# Patient Record
Sex: Female | Born: 1980 | Race: Asian | Hispanic: No | Marital: Married | Smoking: Never smoker
Health system: Southern US, Community
[De-identification: ages and names within clinical notes are randomized; demographics above are authoritative.]

## PROBLEM LIST (undated history)

## (undated) DIAGNOSIS — D649 Anemia, unspecified: Secondary | ICD-10-CM

## (undated) DIAGNOSIS — M546 Pain in thoracic spine: Secondary | ICD-10-CM

## (undated) DIAGNOSIS — R000 Tachycardia, unspecified: Secondary | ICD-10-CM

## (undated) DIAGNOSIS — T7840XA Allergy, unspecified, initial encounter: Secondary | ICD-10-CM

## (undated) DIAGNOSIS — R42 Dizziness and giddiness: Secondary | ICD-10-CM

## (undated) DIAGNOSIS — Z8619 Personal history of other infectious and parasitic diseases: Secondary | ICD-10-CM

## (undated) DIAGNOSIS — M25512 Pain in left shoulder: Secondary | ICD-10-CM

## (undated) DIAGNOSIS — M5412 Radiculopathy, cervical region: Secondary | ICD-10-CM

## (undated) DIAGNOSIS — E785 Hyperlipidemia, unspecified: Secondary | ICD-10-CM

## (undated) DIAGNOSIS — R739 Hyperglycemia, unspecified: Secondary | ICD-10-CM

## (undated) DIAGNOSIS — R011 Cardiac murmur, unspecified: Secondary | ICD-10-CM

## (undated) DIAGNOSIS — M545 Low back pain, unspecified: Secondary | ICD-10-CM

## (undated) HISTORY — DX: Allergy, unspecified, initial encounter: T78.40XA

## (undated) HISTORY — DX: Pain in thoracic spine: M54.6

## (undated) HISTORY — DX: Hyperlipidemia, unspecified: E78.5

## (undated) HISTORY — DX: Pain in left shoulder: M25.512

## (undated) HISTORY — DX: Personal history of other infectious and parasitic diseases: Z86.19

## (undated) HISTORY — DX: Anemia, unspecified: D64.9

## (undated) HISTORY — PX: HERNIA REPAIR: SHX51

## (undated) HISTORY — DX: Tachycardia, unspecified: R00.0

## (undated) HISTORY — DX: Low back pain, unspecified: M54.50

## (undated) HISTORY — DX: Hyperglycemia, unspecified: R73.9

## (undated) HISTORY — DX: Cardiac murmur, unspecified: R01.1

## (undated) HISTORY — DX: Radiculopathy, cervical region: M54.12

## (undated) HISTORY — DX: Low back pain: M54.5

## (undated) HISTORY — DX: Dizziness and giddiness: R42

---

## 2007-03-30 ENCOUNTER — Ambulatory Visit (HOSPITAL_COMMUNITY): Admission: RE | Admit: 2007-03-30 | Discharge: 2007-03-30 | Payer: Self-pay | Admitting: Obstetrics and Gynecology

## 2011-03-01 ENCOUNTER — Other Ambulatory Visit (HOSPITAL_COMMUNITY)
Admission: RE | Admit: 2011-03-01 | Discharge: 2011-03-01 | Disposition: A | Discharge status: Home / Self Care | Payer: Self-pay | Source: Ambulatory Visit | Attending: Obstetrics & Gynecology | Admitting: Obstetrics & Gynecology

## 2011-03-01 DIAGNOSIS — Z1159 Encounter for screening for other viral diseases: Secondary | ICD-10-CM | POA: Insufficient documentation

## 2011-03-01 DIAGNOSIS — Z01419 Encounter for gynecological examination (general) (routine) without abnormal findings: Secondary | ICD-10-CM | POA: Insufficient documentation

## 2012-03-02 ENCOUNTER — Other Ambulatory Visit (HOSPITAL_COMMUNITY)
Admission: RE | Admit: 2012-03-02 | Discharge: 2012-03-02 | Disposition: A | Discharge status: Home / Self Care | Payer: BC Managed Care – PPO | Source: Ambulatory Visit | Attending: Obstetrics and Gynecology | Admitting: Obstetrics and Gynecology

## 2012-03-02 DIAGNOSIS — Z113 Encounter for screening for infections with a predominantly sexual mode of transmission: Secondary | ICD-10-CM | POA: Insufficient documentation

## 2012-03-02 DIAGNOSIS — Z124 Encounter for screening for malignant neoplasm of cervix: Secondary | ICD-10-CM | POA: Insufficient documentation

## 2013-12-24 ENCOUNTER — Ambulatory Visit (INDEPENDENT_AMBULATORY_CARE_PROVIDER_SITE_OTHER): Payer: BC Managed Care – PPO | Admitting: Internal Medicine

## 2013-12-24 ENCOUNTER — Encounter: Payer: Self-pay | Admitting: Internal Medicine

## 2013-12-24 VITALS — BP 119/79 | HR 80 | Ht 63.0 in | Wt 117.0 lb

## 2013-12-24 DIAGNOSIS — R0789 Other chest pain: Secondary | ICD-10-CM

## 2013-12-24 DIAGNOSIS — R002 Palpitations: Secondary | ICD-10-CM

## 2013-12-24 DIAGNOSIS — R0602 Shortness of breath: Secondary | ICD-10-CM

## 2013-12-24 LAB — CBC
HEMATOCRIT: 32.4 % — AB (ref 36.0–46.0)
HEMOGLOBIN: 10.3 g/dL — AB (ref 12.0–15.0)
MCHC: 31.9 g/dL (ref 30.0–36.0)
MCV: 73 fl — AB (ref 78.0–100.0)
PLATELETS: 240 10*3/uL (ref 150.0–400.0)
RBC: 4.43 Mil/uL (ref 3.87–5.11)
RDW: 15 % (ref 11.5–15.5)
WBC: 6.3 10*3/uL (ref 4.0–10.5)

## 2013-12-24 LAB — TSH: TSH: 1.49 u[IU]/mL (ref 0.35–4.50)

## 2013-12-24 NOTE — Progress Notes (Signed)
HPI   Patient is a 33 yo who is referred for evaluation of CP and palpitations. The patient says that she has had spells of back/chest/L arm pressure for the past 2 to 3 yrs.    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   \ No Known Allergies  No current outpatient prescriptions on file.   No current facility-administered medications for this visit.    Past Medical History  Diagnosis Date  . Heart murmur     Past Surgical History  Procedure Laterality Date  . Hernia repair      2007  . Cesarean section      2005    Family History  Problem Relation Age of Onset  . Hypertension Mother   . Hypertension Father   . Hypertension Maternal Grandmother   . Diabetes Maternal Grandfather   . Stroke Maternal Grandfather   . Hypertension Maternal Grandfather   . Hypertension Paternal Grandmother   . Hypertension Paternal Grandfather     History   Social History  . Marital Status: Married    Spouse Name: N/A    Number of Children: N/A  . Years of Education: N/A   Occupational History  . Not on file.   Social History Main Topics  . Smoking status: Never Smoker   . Smokeless tobacco: Not on file  . Alcohol Use: No  . Drug Use: No  . Sexual Activity: Not on file   Other Topics Concern  . 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  They are negative to the above problem except as previously stated.  Vital Signs: BP 119/79  Pulse 80  Ht 5\' 3"  (1.6 m)  Wt 117 lb (53.071 kg)  BMI 20.73 kg/m2  Physical Exam Patient is in NAD HEENT:  Normocephalic, atraumatic. EOMI, PERRLA.  Neck: JVP is normal.  No bruits.  Lungs: clear to  auscultation. No rales no wheezes.  Heart: Regular rate and rhythm. Normal S1, S2. No S3.   No significant murmurs. PMI not displaced.  Abdomen:  Supple, nontender. Normal bowel sounds. No masses. No hepatomegaly.  Extremities:   Good distal pulses throughout. No lower extremity edema.  Musculoskeletal :moving all extremities.  Neuro:   alert and oriented x3.  CN II-XII grossly intact.  EKG:  SR 80    Assessment and Plan:  1  CP  Patient's symtoms of CP are atypical  Does have some dyspnea.  WIll get echo to evaluate, mostly to evaluate dyspnea.  2.  Palpitations.  Short lived  Do not sound destabilizng.  Reassurer pt.  Check labs.

## 2013-12-24 NOTE — Patient Instructions (Signed)
Your physician recommends that you return for lab work in: TODAY.   (TSH, CBC)  Your physician recommends that you continue on your current medications as directed. Please refer to the Current Medication list given to you today.  Your physician has requested that you have an echocardiogram. Echocardiography is a painless test that uses sound waves to create images of your heart. It provides your doctor with information about the size and shape of your heart and how well your heart's chambers and valves are working. This procedure takes approximately one hour. There are no restrictions for this procedure.

## 2014-02-04 ENCOUNTER — Ambulatory Visit (HOSPITAL_COMMUNITY): Payer: BC Managed Care – PPO | Attending: Cardiovascular Disease | Admitting: Cardiology

## 2014-02-04 DIAGNOSIS — R0789 Other chest pain: Secondary | ICD-10-CM

## 2014-02-04 DIAGNOSIS — R0609 Other forms of dyspnea: Secondary | ICD-10-CM | POA: Insufficient documentation

## 2014-02-04 DIAGNOSIS — R002 Palpitations: Secondary | ICD-10-CM | POA: Insufficient documentation

## 2014-02-04 DIAGNOSIS — R0602 Shortness of breath: Secondary | ICD-10-CM

## 2014-02-04 DIAGNOSIS — R079 Chest pain, unspecified: Secondary | ICD-10-CM | POA: Insufficient documentation

## 2014-02-04 DIAGNOSIS — R0989 Other specified symptoms and signs involving the circulatory and respiratory systems: Secondary | ICD-10-CM | POA: Insufficient documentation

## 2014-02-04 NOTE — Progress Notes (Signed)
Echo performed. 

## 2014-02-11 ENCOUNTER — Other Ambulatory Visit: Payer: Self-pay | Admitting: *Deleted

## 2014-02-11 ENCOUNTER — Telehealth: Payer: Self-pay | Admitting: *Deleted

## 2014-02-11 DIAGNOSIS — D582 Other hemoglobinopathies: Secondary | ICD-10-CM

## 2014-02-11 DIAGNOSIS — R002 Palpitations: Secondary | ICD-10-CM

## 2014-02-11 NOTE — Telephone Encounter (Signed)
Message from Dr. Tenny Crawoss. 02/08/14:  "Echo is NORMAL. Systolic and diastolic properites of heart are normal. Please set patient up for regular stres test to eval palpitaitons and exercise capacity."  Informed patient someone will be calling her to set up GXT. Also, she inquired about labs from last month. Informed her of Dr. Tenny Crawoss recommendation for abnormal H andH :    Notes Recorded by Pricilla RifflePaula Ross V, MD on 12/25/2013 at 10:02 AM  Thyroid funciton is normal  Patient is mildly anemic. When comes in for echo order anemia panel. Order placed for anemia panel.  She will schedule on same day as the GXT.

## 2014-03-15 ENCOUNTER — Encounter: Payer: BC Managed Care – PPO | Admitting: Nurse Practitioner

## 2014-03-15 ENCOUNTER — Other Ambulatory Visit: Payer: BC Managed Care – PPO

## 2014-03-21 ENCOUNTER — Ambulatory Visit (INDEPENDENT_AMBULATORY_CARE_PROVIDER_SITE_OTHER): Payer: BC Managed Care – PPO

## 2014-03-21 ENCOUNTER — Encounter: Payer: BC Managed Care – PPO | Admitting: Physician Assistant

## 2014-03-21 ENCOUNTER — Ambulatory Visit (INDEPENDENT_AMBULATORY_CARE_PROVIDER_SITE_OTHER): Payer: BC Managed Care – PPO | Admitting: *Deleted

## 2014-03-21 DIAGNOSIS — D582 Other hemoglobinopathies: Secondary | ICD-10-CM

## 2014-03-21 DIAGNOSIS — R0602 Shortness of breath: Secondary | ICD-10-CM

## 2014-03-21 DIAGNOSIS — R002 Palpitations: Secondary | ICD-10-CM

## 2014-03-21 LAB — VITAMIN B12: VITAMIN B 12: 439 pg/mL (ref 211–911)

## 2014-03-21 LAB — IBC PANEL
IRON: 30 ug/dL — AB (ref 42–145)
SATURATION RATIOS: 6.2 % — AB (ref 20.0–50.0)
TRANSFERRIN: 343 mg/dL (ref 212.0–360.0)

## 2014-03-21 LAB — FOLATE: Folate: 17.2 ng/mL (ref >5.9)

## 2014-03-21 LAB — FERRITIN: Ferritin: 2.6 ng/mL — ABNORMAL LOW (ref 10.0–291.0)

## 2014-03-21 NOTE — Progress Notes (Signed)
Exercise Treadmill Test  Pre-Exercise Testing Evaluation Rhythm: normal sinus  Rate: 84 bpm     Test  Exercise Tolerance Test Ordering MD: Dietrich Pates, MD  Interpreting MD: Carlean Jews, PA  Unique Test No: 1  Treadmill:  1  Indication for ETT: exertional dyspnea/Palps  Contraindication to ETT: No   Stress Modality: exercise - treadmill  Cardiac Imaging Performed: non   Protocol: standard Bruce - maximal  Max BP:  160/56  Max MPHR (bpm):  186 85% MPR (bpm):  158  MPHR obtained (bpm):  173 % MPHR obtained:  93%  Reached 85% MPHR (min:sec):   Total Exercise Time (min-sec): 8 min  Workload in METS:  10 Borg Scale: very hard (17)  Reason ETT Terminated:  desired heart rate attained    ST Segment Analysis At Rest: normal ST segments - no evidence of significant ST depression With Exercise: no evidence of significant ST depression  Other Information Arrhythmia:  No Angina during ETT:  absent (0) Quality of ETT:  diagnostic  ETT Interpretation:  normal - no evidence of ischemia by ST analysis  Comments: Patient did very well. She has some slight DOE but no chest pain.   Recommendations: Follow up with primary care and Dr. Tenny Craw as needed.

## 2014-03-26 ENCOUNTER — Telehealth: Payer: Self-pay

## 2014-03-26 DIAGNOSIS — D509 Iron deficiency anemia, unspecified: Secondary | ICD-10-CM

## 2014-03-26 NOTE — Telephone Encounter (Signed)
lmtcb

## 2014-03-26 NOTE — Telephone Encounter (Signed)
Tara Stevenson patient ,wil forward back to [employer] RN

## 2014-03-26 NOTE — Telephone Encounter (Signed)
Message copied by Nori Riis on Tue Mar 26, 2014  9:37 AM ------      Message from: Dietrich Pates V      Created: Mon Mar 25, 2014 12:41 PM       Patient with iron deficiency.  WOuld recomm 324 mg FeSO4 2x per day.      Are her menses heavy?      F/U CBC in 2 months.  Call with problems ------

## 2014-03-26 NOTE — Telephone Encounter (Signed)
Called pt per Dr. Tenny Craw recommendation.  Advised her that Dr. Tenny Craw would like for her to start on FeSO4 BID.  States she has some iron tablets at home, she thinks they are 500 mg and will take those-she doesn't want to take pill BID. States her menses is heavy.  Scheduled her for CBC in November. Advised to call if has any questions or concerns.

## 2014-03-26 NOTE — Addendum Note (Signed)
Addended by: Murrell Redden E on: 03/26/2014 11:37 AM   Modules accepted: Orders

## 2014-05-28 ENCOUNTER — Other Ambulatory Visit: Payer: BC Managed Care – PPO

## 2014-08-12 ENCOUNTER — Ambulatory Visit: Payer: BLUE CROSS/BLUE SHIELD | Admitting: Neurology

## 2014-08-12 ENCOUNTER — Telehealth: Payer: Self-pay | Admitting: Neurology

## 2014-08-12 NOTE — Telephone Encounter (Signed)
Called and left Vm message that office closed today until 10:00.

## 2014-08-27 ENCOUNTER — Encounter: Payer: Self-pay | Admitting: Neurology

## 2014-08-28 ENCOUNTER — Encounter: Payer: Self-pay | Admitting: Neurology

## 2014-08-28 ENCOUNTER — Ambulatory Visit: Payer: BLUE CROSS/BLUE SHIELD | Admitting: Neurology

## 2014-09-04 ENCOUNTER — Encounter: Payer: Self-pay | Admitting: Neurology

## 2014-11-08 ENCOUNTER — Ambulatory Visit (INDEPENDENT_AMBULATORY_CARE_PROVIDER_SITE_OTHER): Payer: BLUE CROSS/BLUE SHIELD | Admitting: Neurology

## 2014-11-08 ENCOUNTER — Encounter: Payer: Self-pay | Admitting: Neurology

## 2014-11-08 VITALS — BP 125/81 | HR 82 | Resp 14 | Ht 63.0 in | Wt 120.0 lb

## 2014-11-08 DIAGNOSIS — M25512 Pain in left shoulder: Secondary | ICD-10-CM | POA: Diagnosis not present

## 2014-11-08 DIAGNOSIS — R202 Paresthesia of skin: Secondary | ICD-10-CM | POA: Diagnosis not present

## 2014-11-08 DIAGNOSIS — M79602 Pain in left arm: Secondary | ICD-10-CM | POA: Diagnosis not present

## 2014-11-08 NOTE — Progress Notes (Signed)
Subjective:    Patient ID: Tara Stevenson is a 34 y.o. female.  HPI     Tara Foley, MD, PhD [facility] [address] [address] [place], [place] [address]  Dear Dr. Shelle Iron,   I saw your patient, Tara Stevenson, upon your kind request in my neurologic clinic today for initial consultation of her left-sided neck pain radiating to her left anterior forearm, associated with left-sided grip weakness. The patient is unaccompanied today. As you know, Tara Stevenson is a 34 year old right-handed woman with an underlying benign medical history, who reports left shoulder, neck pain radiating to her left arm since 2009 after she had her C-section. Symptoms are nonprogressive, but also have not improved very much. She has gone through physical therapy, has tried anti-inflammatory medication, and you had consider trigger point injections. She was given exercises which she has not been doing regularly and when you saw her on 07/15/2014 after her cervical spine MRI you noted a fairly normal neurological exam. She had an MRI cervical spine without contrast on 07/08/2014, which showed mild reversal of the cervical lordosis suggestive of muscle spasm, otherwise unremarkable study. You talked her about the test results. She was advised to continue with home exercises and changing carrying her purse from her left side to her right side. She has not had an EMG nerve conduction test.  She gets routine blood work done through her GYN. She gets a physical every year. She says blood work was fine including thyroid function tests. She reports intermittent tingling in her left forearm in particular her ring and middle finger. She reports grip strength weakness and some fatigue throughout the day. 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 She has 2 children, the younger one is 37 years old. She volunteers at their school. She is not impaired in her activities of daily living. She has not noticed  any twitching or abnormal involuntary movements otherwise or muscle wasting. She has not noticed any permanent weakness or numbness. She feels numb sometimes in the medial aspect of her left shoulder blade. She feels that there is a knot sometimes there. She has had massage therapy which did not help that much. She is primarily wondering why things are not improving. She is supposed to be on an oral iron supplement for iron deficiency anemia. She says her last hemoglobin was around 9 point something. I do not have lab results available for review. We will request lab results from her GYN office. She also recently had blood work which showed apparently low vitamin D but she has not yet discussed this with her GYN.  She tries to get enough sleep time. Usually she is in bed around 10:30. Her rise time is 6 AM. She may not drink enough water however.  Her Past Medical History Is Significant For: Past Medical History  Diagnosis Date  . Heart murmur   . Anemia   . Thoracic spine pain   . Cervical radiculitis   . Lumbar spine pain     Her Past Surgical History Is Significant For: Past Surgical History  Procedure Laterality Date  . Hernia repair      2007  . Cesarean section      2005    Her Family History Is Significant For: Family History  Problem Relation Age of Onset  . Hypertension Mother   . Osteoarthritis Mother   . Hypertension Father   . Hypertension Maternal Grandmother   . Diabetes Maternal Grandfather   .  Stroke Maternal Grandfather   . Hypertension Maternal Grandfather   . Hypertension Paternal Grandmother   . Diabetes Paternal Grandmother   . Cerebrovascular Accident Paternal Grandmother   . Hypertension Paternal Grandfather   . Cerebrovascular Accident Paternal Grandfather     Her Social History Is Significant For: History   Social History  . Marital Status: Married    Spouse Name: N/A  . Number of Children: 2  . Years of Education: BS degree   Occupational  History  . Self employeed    Social History Main Topics  . Smoking status: Never Smoker   . Smokeless tobacco: Never Used  . Alcohol Use: No  . Drug Use: No  . Sexual Activity: Not on file   Other Topics Concern  . 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 Norethin Ace-Eth Estrad-FE (MINASTRIN 24 FE PO) Take by mouth.  . naproxen sodium (ANAPROX) 220 MG tablet Take 220 mg by mouth 2 (two) times daily with a meal.  : Review of Systems:  Out of a complete 14 point review of systems, all are reviewed and negative with the exception of these symptoms as listed below:   Review of Systems  HENT: Positive for rhinorrhea.   Respiratory: Positive for cough and shortness of breath.   Allergic/Immunologic: Positive for environmental allergies.  Neurological: Positive for weakness and numbness.       L shoulder pain and numbness, pain radiates from L shoulder to L finger tips  Hematological:       Anemia    Objective:  Neurologic Exam  Physical Exam Physical Examination:   Filed Vitals:   11/08/14 1041  BP: 125/81  Pulse: 82  Resp: 14   General Examination: The patient is a very pleasant 34 y.o. female in no acute distress. She appears well-developed and well-nourished and well groomed.   HEENT: Normocephalic, atraumatic, pupils are equal, round and reactive to light and accommodation. Funduscopic exam is normal with sharp disc margins noted. Extraocular tracking is good without limitation to gaze excursion or nystagmus noted. Normal smooth pursuit is noted. Hearing is grossly intact. Tympanic membranes are clear bilaterally. Face is symmetric with normal facial  animation and normal facial sensation. Speech is clear with no dysarthria noted. There is no hypophonia. There is no lip, neck/head, jaw or voice tremor. Neck is supple with full range of passive and active motion. There are no carotid bruits on auscultation. Oropharynx exam reveals: mild mouth dryness, good dental hygiene and no significant airway crowding. Mallampati is class I. Tongue protrudes centrally and palate elevates symmetrically.   Chest: Clear to auscultation without wheezing, rhonchi or crackles noted.  Heart: S1+S2+0, regular and normal without murmurs, rubs or gallops noted.   Abdomen: Soft, non-tender and non-distended with normal bowel sounds appreciated on auscultation.  Extremities: There is no pitting edema in the distal lower extremities bilaterally. Pedal pulses are intact.  Skin: Warm and dry without trophic changes noted. There are no varicose veins.  Musculoskeletal: exam reveals no obvious joint deformities, tenderness or joint swelling or erythema,  with the exception of very mild tenderness along the medial aspect of her shoulder blade on the left. I did not appreciate any significant abnormalities on palpation. She has normal range of motion in both upper extremities. She has normal range of motion in both lower extremities. She has no evidence of fasciculations in her hands or feet and no focal atrophy in the distal extremities.  Neurologically:  Mental status: The patient is awake, alert and oriented in all 4 spheres. Her immediate and remote memory, attention, language skills and fund of knowledge are appropriate. There is no evidence of aphasia, agnosia, apraxia or anomia. Speech is clear with normal prosody and enunciation. Thought process is linear. Mood is normal and affect is normal.  Cranial nerves II - XII are as described above under HEENT exam. In addition: shoulder shrug is normal with equal shoulder height noted. Motor exam: Normal bulk, strength and tone  is noted. There is no drift, tremor or rebound. Romberg is negative. Reflexes are 2+ throughout. Babinski: Toes are flexor bilaterally. Fine motor skills and coordination: intact with normal finger taps, normal hand movements, normal rapid alternating patting, normal foot taps and normal foot agility.  Cerebellar testing: No dysmetria or intention tremor on finger to nose testing. Heel to shin is unremarkable bilaterally. There is no truncal or gait ataxia.  Sensory exam: intact to light touch, pinprick, vibration, temperature sense in the upper and lower extremities.  Gait, station and balance: She stands easily. No veering to one side is noted. No leaning to one side is noted. Posture is age-appropriate and stance is narrow based. Gait shows normal stride length and normal pace. No problems turning are noted. She turns en bloc. Tandem walk is unremarkable. Intact toe and heel stance is noted.               Assessment and Plan:   In summary, Tara Stevenson is a very pleasant 34 y.o.-year old female with an underlying benign medical history, who reports left shoulder, neck pain radiating to her left arm since 2009 after she had her C-section. Symptoms have not progressed. They have also not improved very much. She has pursued physical therapy but has not been doing her exercises she was advised to do. She carries her purse stolen her left side by habit. Physical exam and particularly her neurological exam today are normal. She does report some tenderness in her left shoulder area, medial to the left shoulder blade. I've reassured her that her exam is otherwise nonfocal. I did suggest that we could pursue EMG/nerve conduction testing of her upper extremities and compare left to right. She has had recent blood work with her GYN and needs to discuss the findings. She may have low vitamin D. She is also advised to follow through with her oral iron supplements as she is complaining of fatigue at times. She is  advised to follow through with the exercises that she has been given. I think it will help her to do strengthening exercises. She would like to hold off on electrophysiological testing because she would like to see if she can improve her symptoms with more regularity in her exercises and following through with her other issues such as anemia treatment and low vitamin D treatment. I think we can pursue more watchful waiting as she is not progressive in her symptoms and her presentation and history are benign and workup thus far in particular with MRI cervical spine was also benign. At this juncture, I  will see her back on an as-needed basis. She is encouraged to call for an update and if she would like to pursue EMG and nerve conduction testing in the near future we can certainly help with that and take it from there.   I answered all her questions today and the patient was in agreement.  Thank you very much for allowing me to participate in the care of this nice patient. If I can be of any further assistance to you please do not hesitate to call me at [phone].  Sincerely,   Tara FoleySaima Clinton Dragone, MD, PhD

## 2014-11-08 NOTE — Patient Instructions (Addendum)
Your exam looks benign and so did your recent cervical spine MRI, which are reassuring findings. Since your symptoms are not progressive at this point, we can see if you improve with the exercised Dr. Shelle IronBeane suggested and you are supposed to take iron supplements and you may need vitamin D.   Drink plenty of water about 6-8 glasses and try to get enough rest.   We will consider an EMG/NCV test (electrical muscle and nerve test) down the road. I will see you as needed. You can call if you would like to pursue the test, as discussed.

## 2015-06-24 ENCOUNTER — Ambulatory Visit: Payer: Self-pay | Admitting: Family

## 2015-06-25 ENCOUNTER — Ambulatory Visit (INDEPENDENT_AMBULATORY_CARE_PROVIDER_SITE_OTHER): Payer: Self-pay | Admitting: Family

## 2015-06-25 ENCOUNTER — Encounter: Payer: Self-pay | Admitting: Family

## 2015-06-25 VITALS — BP 124/74 | HR 84 | Temp 98.5°F | Resp 18 | Wt 119.6 lb

## 2015-06-25 DIAGNOSIS — R42 Dizziness and giddiness: Secondary | ICD-10-CM

## 2015-06-25 DIAGNOSIS — J309 Allergic rhinitis, unspecified: Secondary | ICD-10-CM

## 2015-06-25 DIAGNOSIS — D509 Iron deficiency anemia, unspecified: Secondary | ICD-10-CM

## 2015-06-25 DIAGNOSIS — F411 Generalized anxiety disorder: Secondary | ICD-10-CM

## 2015-06-25 LAB — CBC WITH DIFFERENTIAL/PLATELET
BASOS ABS: 0 10*3/uL (ref 0.0–0.1)
BASOS PCT: 0.3 % (ref 0.0–3.0)
Eosinophils Absolute: 0 10*3/uL (ref 0.0–0.7)
Eosinophils Relative: 0.7 % (ref 0.0–5.0)
HEMATOCRIT: 40.5 % (ref 36.0–46.0)
Hemoglobin: 13.2 g/dL (ref 12.0–15.0)
LYMPHS ABS: 1.5 10*3/uL (ref 0.7–4.0)
LYMPHS PCT: 24.6 % (ref 12.0–46.0)
MCHC: 32.6 g/dL (ref 30.0–36.0)
MCV: 80.3 fl (ref 78.0–100.0)
MONOS PCT: 8.1 % (ref 3.0–12.0)
Monocytes Absolute: 0.5 10*3/uL (ref 0.1–1.0)
NEUTROS ABS: 4 10*3/uL (ref 1.4–7.7)
NEUTROS PCT: 66.3 % (ref 43.0–77.0)
PLATELETS: 253 10*3/uL (ref 150.0–400.0)
RBC: 5.04 Mil/uL (ref 3.87–5.11)
RDW: 15.6 % — AB (ref 11.5–15.5)
WBC: 6.1 10*3/uL (ref 4.0–10.5)

## 2015-06-25 LAB — COMPREHENSIVE METABOLIC PANEL
ALT: 13 U/L (ref 0–35)
AST: 17 U/L (ref 0–37)
Albumin: 3.9 g/dL (ref 3.5–5.2)
Alkaline Phosphatase: 56 U/L (ref 39–117)
BILIRUBIN TOTAL: 0.2 mg/dL (ref 0.2–1.2)
BUN: 12 mg/dL (ref 6–23)
CALCIUM: 9.3 mg/dL (ref 8.4–10.5)
CHLORIDE: 105 meq/L (ref 96–112)
CO2: 27 meq/L (ref 19–32)
CREATININE: 0.7 mg/dL (ref 0.40–1.20)
GFR: 100.75 mL/min (ref >60.00)
GLUCOSE: 94 mg/dL (ref 70–99)
Potassium: 4.2 mEq/L (ref 3.5–5.1)
Sodium: 139 mEq/L (ref 135–145)
Total Protein: 7.3 g/dL (ref 6.0–8.3)

## 2015-06-25 LAB — TSH: TSH: 1.44 u[IU]/mL (ref 0.35–4.50)

## 2015-06-25 MED ORDER — DIAZEPAM 2 MG PO TABS: ORAL_TABLET | ORAL | Status: DC

## 2015-06-25 NOTE — Progress Notes (Signed)
Pre visit review using our clinic review tool, if applicable. No additional management support is needed unless otherwise documented below in the visit note. 

## 2015-06-25 NOTE — Progress Notes (Signed)
Subjective:    Patient ID: Tara Stevenson, female    DOB: 1981-03-29, 34 y.o.   MRN: [id number]  HPI  Tara Stevenson is a 34 yr old female who is scheduled to establish with Dr. Abner GreenspanBlyth but is brought in today for an acute visit due to complaint of dizziness which is worse after eating.  Reports panic attacks in close spaces.    Reports that she had an MRI performed of  her back.  Noets that she required valium due to claustrophobia in MRI machine.  Developed panic attack while in a work meeting.  Reports excessive worry.  Denies compulsive behaviors.  Denies new stressors.  She is especially concerned about an upcoming 13 hour flight she has planned in January to [place].   Dizziness- reports that she has anemia.  Reports that she has dizziness after eating.  Has stopped eating large meals- instead eating smaller more frequent meals. She sees Dr. Tenny Crawoss GYN.      Review of Systems  Constitutional: Negative for unexpected weight change.  HENT:       Chronic allergic rhinitis  Respiratory: Negative for cough.        Notes some sob which she attributes to anemia  Cardiovascular: Negative for chest pain.  Genitourinary: Negative for dysuria.       Some vaginal discomfort- has gyn apt  Musculoskeletal: Negative for myalgias.       Occasional toe pain and left shoulder pain  Neurological: Negative for headaches.  Hematological: Negative for adenopathy.  Psychiatric/Behavioral:       Denies depression   Past Medical History  Diagnosis Date  . Heart murmur   . Anemia   . Thoracic spine pain   . Cervical radiculitis   . Lumbar spine pain     Social History   Social History  . Marital Status: Married    Spouse Name: N/A  . Number of Children: 2  . Years of Education: BS degree   Occupational History  . Self employeed    Social History Main Topics  . Smoking status: Never Smoker   . Smokeless tobacco: Never Used  . Alcohol Use: No  . Drug Use: No  . Sexual Activity: Not on  file   Other Topics Concern  . 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 Hernia repair      2007  . Cesarean section      2005    Family History  Problem Relation Age of Onset  . Hypertension Mother   . Osteoarthritis Mother   . Hypertension Father   . Hypertension Maternal Grandmother   . Diabetes Maternal Grandfather   . Stroke Maternal Grandfather   . Hypertension Maternal Grandfather   . Hypertension Paternal Grandmother   . Diabetes Paternal Grandmother   . Cerebrovascular Accident Paternal Grandmother   . Hypertension Paternal Grandfather   . Cerebrovascular Accident Paternal Grandfather     No Known Allergies  Current Outpatient Prescriptions on File Prior to Visit  Medication Sig  Dispense Refill  . Norethin Ace-Eth Estrad-FE (MINASTRIN 24 FE PO) Take by mouth.     No current facility-administered medications on file prior to visit.    BP 124/74 mmHg  Pulse 84  Temp(Src) 98.5 F (36.9 C) (Oral)  Resp 18  Wt 119 lb 9.6 oz (54.25 kg)  SpO2 100%  LMP 05/31/2015        Objective:   Physical Exam  Constitutional: She is oriented to person, place, and time. She appears well-developed and well-nourished.  HENT:  Right Ear: Tympanic membrane and ear canal normal.  Left Ear: Tympanic membrane and ear canal normal.  Eyes: Pupils are equal, round, and reactive to light.  Cardiovascular: Normal rate, regular rhythm and normal heart sounds.   No murmur heard. Pulmonary/Chest: Effort normal and breath sounds normal. No respiratory distress. She has no wheezes.  Neurological: She is alert and oriented to person, place, and time.  Psychiatric:  She has a normal mood and affect. Her behavior is normal. Judgment and thought content normal.          Assessment & Plan:  Dizziness- suspect that her anxiety is a contributing factor.  BMET is normal, TSH, CBC all normal. Monitor.  Lab Results  Component Value Date   TSH 1.44 06/25/2015

## 2015-06-25 NOTE — Patient Instructions (Signed)
You may use valium one tablet prior to airplane flights. Complete lab work prior to leaving. Call if your dizziness/anxiety worsens or does not improve.

## 2015-06-26 ENCOUNTER — Encounter: Payer: Self-pay | Admitting: Family

## 2015-06-26 DIAGNOSIS — F411 Generalized anxiety disorder: Secondary | ICD-10-CM | POA: Insufficient documentation

## 2015-06-26 NOTE — Assessment & Plan Note (Signed)
We discussed addition of SSRI to improve overall anxiety and panic symptoms. She does not wish to begin a daily med. Feels like she really just wants something to help her on her upcoming flight and can generally "talk myself through" her panic attacks and claustrophobia.  Advised pt to let us know if her symptoms worsen.  Pt was given 2 tabs of valium- one for each direction on her upcoming trip to [place].   30 min spent with pt today.  >50% of this time was spent counseling patient on anxiety and anxiety treatment.

## 2015-06-26 NOTE — Assessment & Plan Note (Signed)
Lab Results  Component Value Date   WBC 6.1 06/25/2015   HGB 13.2 06/25/2015   HCT 40.5 06/25/2015   MCV 80.3 06/25/2015   PLT 253.0 06/25/2015   Follow up CBC is normal.

## 2015-07-01 ENCOUNTER — Telehealth: Payer: Self-pay | Admitting: *Deleted

## 2015-07-01 NOTE — Telephone Encounter (Signed)
Lab letter mailed. 

## 2015-07-01 NOTE — Telephone Encounter (Signed)
-----   Message from Pincus Sanesobin B Ewing, CMA sent at 06/30/2015 11:29 AM EST ----- Regarding: FW: Lab Results Contact: [phone]   ----- Message -----    From: Audelia Actonenise L Smith    Sent: 06/30/2015  11:23 AM      To: Vladimir Croftsobin B Ewing, CMA Subject: Lab Results                                    Please mail patient a copy of her labs from last week

## 2015-08-20 LAB — HM PAP SMEAR: HM Pap smear: NORMAL

## 2015-12-10 ENCOUNTER — Telehealth: Payer: Self-pay | Admitting: *Deleted

## 2015-12-10 ENCOUNTER — Encounter: Payer: Self-pay | Admitting: *Deleted

## 2015-12-10 NOTE — Telephone Encounter (Signed)
Pre-Visit Call completed with patient and chart updated.   Pre-Visit Info documented in Specialty Comments under [employer].    

## 2015-12-11 ENCOUNTER — Encounter: Payer: Self-pay | Admitting: Family Medicine

## 2015-12-11 ENCOUNTER — Ambulatory Visit (INDEPENDENT_AMBULATORY_CARE_PROVIDER_SITE_OTHER): Payer: BLUE CROSS/BLUE SHIELD | Admitting: Family Medicine

## 2015-12-11 VITALS — BP 110/68 | HR 85 | Temp 98.5°F | Ht 63.0 in | Wt 123.4 lb

## 2015-12-11 DIAGNOSIS — F411 Generalized anxiety disorder: Secondary | ICD-10-CM | POA: Diagnosis not present

## 2015-12-11 DIAGNOSIS — R251 Tremor, unspecified: Secondary | ICD-10-CM | POA: Diagnosis not present

## 2015-12-11 DIAGNOSIS — R350 Frequency of micturition: Secondary | ICD-10-CM

## 2015-12-11 DIAGNOSIS — R42 Dizziness and giddiness: Secondary | ICD-10-CM | POA: Diagnosis not present

## 2015-12-11 DIAGNOSIS — T7840XA Allergy, unspecified, initial encounter: Secondary | ICD-10-CM | POA: Insufficient documentation

## 2015-12-11 DIAGNOSIS — Z8619 Personal history of other infectious and parasitic diseases: Secondary | ICD-10-CM | POA: Insufficient documentation

## 2015-12-11 DIAGNOSIS — Z0000 Encounter for general adult medical examination without abnormal findings: Secondary | ICD-10-CM

## 2015-12-11 NOTE — Progress Notes (Signed)
Pre visit review using our clinic review tool, if applicable. No additional management support is needed unless otherwise documented below in the visit note. 

## 2015-12-11 NOTE — Patient Instructions (Addendum)
Allegra or Zyrtec daily, Flonase daily, Singulair prescription Probiotic NOW company 10 strain cap daily at [employer] or [email or web address]  Preventive Care for Adults, Female A healthy lifestyle and preventive care can promote health and wellness. Preventive health guidelines for women include the following key practices.  A routine yearly physical is a good way to check with your health care provider about your health and preventive screening. It is a chance to share any concerns and updates on your health and to receive a thorough exam.  Visit your dentist for a routine exam and preventive care every 6 months. Brush your teeth twice a day and floss once a day. Good oral hygiene prevents tooth decay and gum disease.  The frequency of eye exams is based on your age, health, family medical history, use of contact lenses, and other factors. Follow your health care provider's recommendations for frequency of eye exams.  Eat a healthy diet. Foods like vegetables, fruits, whole grains, low-fat dairy products, and lean protein foods contain the nutrients you need without too many calories. Decrease your intake of foods high in solid fats, added sugars, and salt. Eat the right amount of calories for you.Get information about a proper diet from your health care provider, if necessary.  Regular physical exercise is one of the most important things you can do for your health. Most adults should get at least 150 minutes of moderate-intensity exercise (any activity that increases your heart rate and causes you to sweat) each week. In addition, most adults need muscle-strengthening exercises on 2 or more days a week.  Maintain a healthy weight. The body mass index (BMI) is a screening tool to identify possible weight problems. It provides an estimate of body fat based on height and weight. Your health care provider can find your BMI and can help you achieve or maintain a healthy weight.For adults 20  years and older:  A BMI below 18.5 is considered underweight.  A BMI of 18.5 to 24.9 is normal.  A BMI of 25 to 29.9 is considered overweight.  A BMI of 30 and above is considered obese.  Maintain normal blood lipids and cholesterol levels by exercising and minimizing your intake of saturated fat. Eat a balanced diet with plenty of fruit and vegetables. Blood tests for lipids and cholesterol should begin at age 87 and be repeated every 5 years. If your lipid or cholesterol levels are high, you are over 50, or you are at high risk for heart disease, you may need your cholesterol levels checked more frequently.Ongoing high lipid and cholesterol levels should be treated with medicines if diet and exercise are not working.  If you smoke, find out from your health care provider how to quit. If you do not use tobacco, do not start.  Lung cancer screening is recommended for adults aged 66-80 years who are at high risk for developing lung cancer because of a history of smoking. A yearly low-dose CT scan of the lungs is recommended for people who have at least a 30-pack-year history of smoking and are a current smoker or have quit within the past 15 years. A pack year of smoking is smoking an average of 1 pack of cigarettes a day for 1 year (for example: 1 pack a day for 30 years or 2 packs a day for 15 years). Yearly screening should continue until the smoker has stopped smoking for at least 15 years. Yearly screening should be stopped for people who develop a  health problem that would prevent them from having lung cancer treatment.  If you are pregnant, do not drink alcohol. If you are breastfeeding, be very cautious about drinking alcohol. If you are not pregnant and choose to drink alcohol, do not have more than 1 drink per day. One drink is considered to be 12 ounces (355 mL) of beer, 5 ounces (148 mL) of wine, or 1.5 ounces (44 mL) of liquor.  Avoid use of street drugs. Do not share needles with  anyone. Ask for help if you need support or instructions about stopping the use of drugs.  High blood pressure causes heart disease and increases the risk of stroke. Your blood pressure should be checked at least every 1 to 2 years. Ongoing high blood pressure should be treated with medicines if weight loss and exercise do not work.  If you are 31-84 years old, ask your health care provider if you should take aspirin to prevent strokes.  Diabetes screening is done by taking a blood sample to check your blood glucose level after you have not eaten for a certain period of time (fasting). If you are not overweight and you do not have risk factors for diabetes, you should be screened once every 3 years starting at age 42. If you are overweight or obese and you are 44-59 years of age, you should be screened for diabetes every year as part of your cardiovascular risk assessment.  Breast cancer screening is essential preventive care for women. You should practice "breast self-awareness." This means understanding the normal appearance and feel of your breasts and may include breast self-examination. Any changes detected, no matter how small, should be reported to a health care provider. Women in their 22s and 30s should have a clinical breast exam (CBE) by a health care provider as part of a regular health exam every 1 to 3 years. After age 59, women should have a CBE every year. Starting at age 55, women should consider having a mammogram (breast X-ray test) every year. Women who have a family history of breast cancer should talk to their health care provider about genetic screening. Women at a high risk of breast cancer should talk to their health care providers about having an MRI and a mammogram every year.  Breast cancer gene (BRCA)-related cancer risk assessment is recommended for women who have family members with BRCA-related cancers. BRCA-related cancers include breast, ovarian, tubal, and peritoneal  cancers. Having family members with these cancers may be associated with an increased risk for harmful changes (mutations) in the breast cancer genes BRCA1 and BRCA2. Results of the assessment will determine the need for genetic counseling and BRCA1 and BRCA2 testing.  Your health care provider may recommend that you be screened regularly for cancer of the pelvic organs (ovaries, uterus, and vagina). This screening involves a pelvic examination, including checking for microscopic changes to the surface of your cervix (Pap test). You may be encouraged to have this screening done every 3 years, beginning at age 46.  For women ages 81-65, health care providers may recommend pelvic exams and Pap testing every 3 years, or they may recommend the Pap and pelvic exam, combined with testing for human papilloma virus (HPV), every 5 years. Some types of HPV increase your risk of cervical cancer. Testing for HPV may also be done on women of any age with unclear Pap test results.  Other health care providers may not recommend any screening for nonpregnant women who are considered low  risk for pelvic cancer and who do not have symptoms. Ask your health care provider if a screening pelvic exam is right for you.  If you have had past treatment for cervical cancer or a condition that could lead to cancer, you need Pap tests and screening for cancer for at least 20 years after your treatment. If Pap tests have been discontinued, your risk factors (such as having a new sexual partner) need to be reassessed to determine if screening should resume. Some women have medical problems that increase the chance of getting cervical cancer. In these cases, your health care provider may recommend more frequent screening and Pap tests.  Colorectal cancer can be detected and often prevented. Most routine colorectal cancer screening begins at the age of 57 years and continues through age 48 years. However, your health care provider may  recommend screening at an earlier age if you have risk factors for colon cancer. On a yearly basis, your health care provider may provide home test kits to check for hidden blood in the stool. Use of a small camera at the end of a tube, to directly examine the colon (sigmoidoscopy or colonoscopy), can detect the earliest forms of colorectal cancer. Talk to your health care provider about this at age 14, when routine screening begins. Direct exam of the colon should be repeated every 5-10 years through age 36 years, unless early forms of precancerous polyps or small growths are found.  People who are at an increased risk for hepatitis B should be screened for this virus. You are considered at high risk for hepatitis B if:  You were born in a country where hepatitis B occurs often. Talk with your health care provider about which countries are considered high risk.  Your parents were born in a high-risk country and you have not received a shot to protect against hepatitis B (hepatitis B vaccine).  You have HIV or AIDS.  You use needles to inject street drugs.  You live with, or have sex with, someone who has hepatitis B.  You get hemodialysis treatment.  You take certain medicines for conditions like cancer, organ transplantation, and autoimmune conditions.  Hepatitis C blood testing is recommended for all people born from [birth year] through 1965 and any individual with known risks for hepatitis C.  Practice safe sex. Use condoms and avoid high-risk sexual practices to reduce the spread of sexually transmitted infections (STIs). STIs include gonorrhea, chlamydia, syphilis, trichomonas, herpes, HPV, and human immunodeficiency virus (HIV). Herpes, HIV, and HPV are viral illnesses that have no cure. They can result in disability, cancer, and death.  You should be screened for sexually transmitted illnesses (STIs) including gonorrhea and chlamydia if:  You are sexually active and are younger than 24  years.  You are older than 24 years and your health care provider tells you that you are at risk for this type of infection.  Your sexual activity has changed since you were last screened and you are at an increased risk for chlamydia or gonorrhea. Ask your health care provider if you are at risk.  If you are at risk of being infected with HIV, it is recommended that you take a prescription medicine daily to prevent HIV infection. This is called preexposure prophylaxis (PrEP). You are considered at risk if:  You are sexually active and do not regularly use condoms or know the HIV status of your partner(s).  You take drugs by injection.  You are sexually active with a partner  who has HIV.  Talk with your health care provider about whether you are at high risk of being infected with HIV. If you choose to begin PrEP, you should first be tested for HIV. You should then be tested every 3 months for as long as you are taking PrEP.  Osteoporosis is a disease in which the bones lose minerals and strength with aging. This can result in serious bone fractures or breaks. The risk of osteoporosis can be identified using a bone density scan. Women ages 15 years and over and women at risk for fractures or osteoporosis should discuss screening with their health care providers. Ask your health care provider whether you should take a calcium supplement or vitamin D to reduce the rate of osteoporosis.  Menopause can be associated with physical symptoms and risks. Hormone replacement therapy is available to decrease symptoms and risks. You should talk to your health care provider about whether hormone replacement therapy is right for you.  Use sunscreen. Apply sunscreen liberally and repeatedly throughout the day. You should seek shade when your shadow is shorter than you. Protect yourself by wearing long sleeves, pants, a wide-brimmed hat, and sunglasses year round, whenever you are outdoors.  Once a month, do a  whole body skin exam, using a mirror to look at the skin on your back. Tell your health care provider of new moles, moles that have irregular borders, moles that are larger than a pencil eraser, or moles that have changed in shape or color.  Stay current with required vaccines (immunizations).  Influenza vaccine. All adults should be immunized every year.  Tetanus, diphtheria, and acellular pertussis (Td, Tdap) vaccine. Pregnant women should receive 1 dose of Tdap vaccine during each pregnancy. The dose should be obtained regardless of the length of time since the last dose. Immunization is preferred during the 27th-36th week of gestation. An adult who has not previously received Tdap or who does not know her vaccine status should receive 1 dose of Tdap. This initial dose should be followed by tetanus and diphtheria toxoids (Td) booster doses every 10 years. Adults with an unknown or incomplete history of completing a 3-dose immunization series with Td-containing vaccines should begin or complete a primary immunization series including a Tdap dose. Adults should receive a Td booster every 10 years.  Varicella vaccine. An adult without evidence of immunity to varicella should receive 2 doses or a second dose if she has previously received 1 dose. Pregnant females who do not have evidence of immunity should receive the first dose after pregnancy. This first dose should be obtained before leaving the health care facility. The second dose should be obtained 4-8 weeks after the first dose.  Human papillomavirus (HPV) vaccine. Females aged 13-26 years who have not received the vaccine previously should obtain the 3-dose series. The vaccine is not recommended for use in pregnant females. However, pregnancy testing is not needed before receiving a dose. If a female is found to be pregnant after receiving a dose, no treatment is needed. In that case, the remaining doses should be delayed until after the pregnancy.  Immunization is recommended for any person with an immunocompromised condition through the age of 26 years if she did not get any or all doses earlier. During the 3-dose series, the second dose should be obtained 4-8 weeks after the first dose. The third dose should be obtained 24 weeks after the first dose and 16 weeks after the second dose.  Zoster vaccine. One dose  is recommended for adults aged 37 years or older unless certain conditions are present.  Measles, mumps, and rubella (MMR) vaccine. Adults born before [birth year] generally are considered immune to measles and mumps. Adults born in [birth year] or later should have 1 or more doses of MMR vaccine unless there is a contraindication to the vaccine or there is laboratory evidence of immunity to each of the three diseases. A routine second dose of MMR vaccine should be obtained at least 28 days after the first dose for students attending postsecondary schools, health care workers, or international travelers. People who received inactivated measles vaccine or an unknown type of measles vaccine during 1963-1967 should receive 2 doses of MMR vaccine. People who received inactivated mumps vaccine or an unknown type of mumps vaccine before 1979 and are at high risk for mumps infection should consider immunization with 2 doses of MMR vaccine. For females of childbearing age, rubella immunity should be determined. If there is no evidence of immunity, females who are not pregnant should be vaccinated. If there is no evidence of immunity, females who are pregnant should delay immunization until after pregnancy. Unvaccinated health care workers born before [birth year] who lack laboratory evidence of measles, mumps, or rubella immunity or laboratory confirmation of disease should consider measles and mumps immunization with 2 doses of MMR vaccine or rubella immunization with 1 dose of MMR vaccine.  Pneumococcal 13-valent conjugate (PCV13) vaccine. When indicated, a person who is  uncertain of his immunization history and has no record of immunization should receive the PCV13 vaccine. All adults 47 years of age and older should receive this vaccine. An adult aged 63 years or older who has certain medical conditions and has not been previously immunized should receive 1 dose of PCV13 vaccine. This PCV13 should be followed with a dose of pneumococcal polysaccharide (PPSV23) vaccine. Adults who are at high risk for pneumococcal disease should obtain the PPSV23 vaccine at least 8 weeks after the dose of PCV13 vaccine. Adults older than 36 years of age who have normal immune system function should obtain the PPSV23 vaccine dose at least 1 year after the dose of PCV13 vaccine.  Pneumococcal polysaccharide (PPSV23) vaccine. When PCV13 is also indicated, PCV13 should be obtained first. All adults aged 44 years and older should be immunized. An adult younger than age 8 years who has certain medical conditions should be immunized. Any person who resides in a nursing home or long-term care facility should be immunized. An adult smoker should be immunized. People with an immunocompromised condition and certain other conditions should receive both PCV13 and PPSV23 vaccines. People with human immunodeficiency virus (HIV) infection should be immunized as soon as possible after diagnosis. Immunization during chemotherapy or radiation therapy should be avoided. Routine use of PPSV23 vaccine is not recommended for American Indians, [place] Natives, or people younger than 65 years unless there are medical conditions that require PPSV23 vaccine. When indicated, people who have unknown immunization and have no record of immunization should receive PPSV23 vaccine. One-time revaccination 5 years after the first dose of PPSV23 is recommended for people aged 19-64 years who have chronic kidney failure, nephrotic syndrome, asplenia, or immunocompromised conditions. People who received 1-2 doses of PPSV23 before age  12 years should receive another dose of PPSV23 vaccine at age 13 years or later if at least 5 years have passed since the previous dose. Doses of PPSV23 are not needed for people immunized with PPSV23 at or after age 73 years.  Meningococcal vaccine.  Adults with asplenia or persistent complement component deficiencies should receive 2 doses of quadrivalent meningococcal conjugate (MenACWY-D) vaccine. The doses should be obtained at least 2 months apart. Microbiologists working with certain meningococcal bacteria, [employer] recruits, people at risk during an outbreak, and people who travel to or live in countries with a high rate of meningitis should be immunized. A first-year college student up through age 77 years who is living in a residence hall should receive a dose if she did not receive a dose on or after her 16th birthday. Adults who have certain high-risk conditions should receive one or more doses of vaccine.  Hepatitis A vaccine. Adults who wish to be protected from this disease, have certain high-risk conditions, work with hepatitis A-infected animals, work in hepatitis A research labs, or travel to or work in countries with a high rate of hepatitis A should be immunized. Adults who were previously unvaccinated and who anticipate close contact with an international adoptee during the first 60 days after arrival in the [place] from a country with a high rate of hepatitis A should be immunized.  Hepatitis B vaccine. Adults who wish to be protected from this disease, have certain high-risk conditions, may be exposed to blood or other infectious body fluids, are household contacts or sex partners of hepatitis B positive people, are clients or workers in certain care facilities, or travel to or work in countries with a high rate of hepatitis B should be immunized.  Haemophilus influenzae type b (Hib) vaccine. A previously unvaccinated person with asplenia or sickle cell disease or having a  scheduled splenectomy should receive 1 dose of Hib vaccine. Regardless of previous immunization, a recipient of a hematopoietic stem cell transplant should receive a 3-dose series 6-12 months after her successful transplant. Hib vaccine is not recommended for adults with HIV infection. Preventive Services / Frequency Ages 54 to 8 years  Blood pressure check.** / Every 3-5 years.  Lipid and cholesterol check.** / Every 5 years beginning at age 32.  Clinical breast exam.** / Every 3 years for women in their 28s and 90s.  BRCA-related cancer risk assessment.** / For women who have family members with a BRCA-related cancer (breast, ovarian, tubal, or peritoneal cancers).  Pap test.** / Every 2 years from ages 47 through 13. Every 3 years starting at age 80 through age 62 or 70 with a history of 3 consecutive normal Pap tests.  HPV screening.** / Every 3 years from ages 57 through ages 81 to 47 with a history of 3 consecutive normal Pap tests.  Hepatitis C blood test.** / For any individual with known risks for hepatitis C.  Skin self-exam. / Monthly.  Influenza vaccine. / Every year.  Tetanus, diphtheria, and acellular pertussis (Tdap, Td) vaccine.** / Consult your health care provider. Pregnant women should receive 1 dose of Tdap vaccine during each pregnancy. 1 dose of Td every 10 years.  Varicella vaccine.** / Consult your health care provider. Pregnant females who do not have evidence of immunity should receive the first dose after pregnancy.  HPV vaccine. / 3 doses over 6 months, if 37 and younger. The vaccine is not recommended for use in pregnant females. However, pregnancy testing is not needed before receiving a dose.  Measles, mumps, rubella (MMR) vaccine.** / You need at least 1 dose of MMR if you were born in 1957 or later. You may also need a 2nd dose. For females of childbearing age, rubella immunity should be determined. If there  is no evidence of immunity, females who are not  pregnant should be vaccinated. If there is no evidence of immunity, females who are pregnant should delay immunization until after pregnancy.  Pneumococcal 13-valent conjugate (PCV13) vaccine.** / Consult your health care provider.  Pneumococcal polysaccharide (PPSV23) vaccine.** / 1 to 2 doses if you smoke cigarettes or if you have certain conditions.  Meningococcal vaccine.** / 1 dose if you are age 64 to 68 years and a Market researcher living in a residence hall, or have one of several medical conditions, you need to get vaccinated against meningococcal disease. You may also need additional booster doses.  Hepatitis A vaccine.** / Consult your health care provider.  Hepatitis B vaccine.** / Consult your health care provider.  Haemophilus influenzae type b (Hib) vaccine.** / Consult your health care provider. Ages 56 to 74 years  Blood pressure check.** / Every year.  Lipid and cholesterol check.** / Every 5 years beginning at age 11 years.  Lung cancer screening. / Every year if you are aged 44-80 years and have a 30-pack-year history of smoking and currently smoke or have quit within the past 15 years. Yearly screening is stopped once you have quit smoking for at least 15 years or develop a health problem that would prevent you from having lung cancer treatment.  Clinical breast exam.** / Every year after age 40 years.  BRCA-related cancer risk assessment.** / For women who have family members with a BRCA-related cancer (breast, ovarian, tubal, or peritoneal cancers).  Mammogram.** / Every year beginning at age 29 years and continuing for as long as you are in good health. Consult with your health care provider.  Pap test.** / Every 3 years starting at age 33 years through age 35 or 86 years with a history of 3 consecutive normal Pap tests.  HPV screening.** / Every 3 years from ages 77 years through ages 74 to 14 years with a history of 3 consecutive normal Pap  tests.  Fecal occult blood test (FOBT) of stool. / Every year beginning at age 37 years and continuing until age 79 years. You may not need to do this test if you get a colonoscopy every 10 years.  Flexible sigmoidoscopy or colonoscopy.** / Every 5 years for a flexible sigmoidoscopy or every 10 years for a colonoscopy beginning at age 18 years and continuing until age 90 years.  Hepatitis C blood test.** / For all people born from [birth year] through 1966 and any individual with known risks for hepatitis C.  Skin self-exam. / Monthly.  Influenza vaccine. / Every year.  Tetanus, diphtheria, and acellular pertussis (Tdap/Td) vaccine.** / Consult your health care provider. Pregnant women should receive 1 dose of Tdap vaccine during each pregnancy. 1 dose of Td every 10 years.  Varicella vaccine.** / Consult your health care provider. Pregnant females who do not have evidence of immunity should receive the first dose after pregnancy.  Zoster vaccine.** / 1 dose for adults aged 24 years or older.  Measles, mumps, rubella (MMR) vaccine.** / You need at least 1 dose of MMR if you were born in 1957 or later. You may also need a second dose. For females of childbearing age, rubella immunity should be determined. If there is no evidence of immunity, females who are not pregnant should be vaccinated. If there is no evidence of immunity, females who are pregnant should delay immunization until after pregnancy.  Pneumococcal 13-valent conjugate (PCV13) vaccine.** / Consult your health care provider.  Pneumococcal polysaccharide (PPSV23) vaccine.** / 1 to 2 doses if you smoke cigarettes or if you have certain conditions.  Meningococcal vaccine.** / Consult your health care provider.  Hepatitis A vaccine.** / Consult your health care provider.  Hepatitis B vaccine.** / Consult your health care provider.  Haemophilus influenzae type b (Hib) vaccine.** / Consult your health care provider. Ages 11 years and  over  Blood pressure check.** / Every year.  Lipid and cholesterol check.** / Every 5 years beginning at age 31 years.  Lung cancer screening. / Every year if you are aged 95-80 years and have a 30-pack-year history of smoking and currently smoke or have quit within the past 15 years. Yearly screening is stopped once you have quit smoking for at least 15 years or develop a health problem that would prevent you from having lung cancer treatment.  Clinical breast exam.** / Every year after age 51 years.  BRCA-related cancer risk assessment.** / For women who have family members with a BRCA-related cancer (breast, ovarian, tubal, or peritoneal cancers).  Mammogram.** / Every year beginning at age 41 years and continuing for as long as you are in good health. Consult with your health care provider.  Pap test.** / Every 3 years starting at age 43 years through age 71 or 51 years with 3 consecutive normal Pap tests. Testing can be stopped between 65 and 70 years with 3 consecutive normal Pap tests and no abnormal Pap or HPV tests in the past 10 years.  HPV screening.** / Every 3 years from ages 22 years through ages 5 or 42 years with a history of 3 consecutive normal Pap tests. Testing can be stopped between 65 and 70 years with 3 consecutive normal Pap tests and no abnormal Pap or HPV tests in the past 10 years.  Fecal occult blood test (FOBT) of stool. / Every year beginning at age 69 years and continuing until age 24 years. You may not need to do this test if you get a colonoscopy every 10 years.  Flexible sigmoidoscopy or colonoscopy.** / Every 5 years for a flexible sigmoidoscopy or every 10 years for a colonoscopy beginning at age 20 years and continuing until age 26 years.  Hepatitis C blood test.** / For all people born from [birth year] through 1966 and any individual with known risks for hepatitis C.  Osteoporosis screening.** / A one-time screening for women ages 21 years and over and women  at risk for fractures or osteoporosis.  Skin self-exam. / Monthly.  Influenza vaccine. / Every year.  Tetanus, diphtheria, and acellular pertussis (Tdap/Td) vaccine.** / 1 dose of Td every 10 years.  Varicella vaccine.** / Consult your health care provider.  Zoster vaccine.** / 1 dose for adults aged 23 years or older.  Pneumococcal 13-valent conjugate (PCV13) vaccine.** / Consult your health care provider.  Pneumococcal polysaccharide (PPSV23) vaccine.** / 1 dose for all adults aged 105 years and older.  Meningococcal vaccine.** / Consult your health care provider.  Hepatitis A vaccine.** / Consult your health care provider.  Hepatitis B vaccine.** / Consult your health care provider.  Haemophilus influenzae type b (Hib) vaccine.** / Consult your health care provider. ** Family history and personal history of risk and conditions may change your health care provider's recommendations.   This information is not intended to replace advice given to you by your health care provider. Make sure you discuss any questions you have with your health care provider.   Document Released: 08/31/2001 Document Revised:  07/26/2014 Document Reviewed: 11/30/2010 Elsevier Interactive Patient Education [employer].

## 2015-12-12 ENCOUNTER — Other Ambulatory Visit (INDEPENDENT_AMBULATORY_CARE_PROVIDER_SITE_OTHER): Payer: BLUE CROSS/BLUE SHIELD

## 2015-12-12 DIAGNOSIS — R739 Hyperglycemia, unspecified: Secondary | ICD-10-CM | POA: Diagnosis not present

## 2015-12-12 LAB — URINALYSIS
BILIRUBIN URINE: NEGATIVE
HGB URINE DIPSTICK: NEGATIVE
Ketones, ur: NEGATIVE
Leukocytes, UA: NEGATIVE
NITRITE: NEGATIVE
Specific Gravity, Urine: 1.015 (ref 1.000–1.030)
TOTAL PROTEIN, URINE-UPE24: NEGATIVE
Urine Glucose: NEGATIVE
Urobilinogen, UA: 0.2 (ref 0.0–1.0)
pH: 7 (ref 5.0–8.0)

## 2015-12-12 LAB — COMPREHENSIVE METABOLIC PANEL
ALBUMIN: 4.2 g/dL (ref 3.5–5.2)
ALK PHOS: 57 U/L (ref 39–117)
ALT: 11 U/L (ref 0–35)
AST: 16 U/L (ref 0–37)
BUN: 12 mg/dL (ref 6–23)
CALCIUM: 9.5 mg/dL (ref 8.4–10.5)
CO2: 29 mEq/L (ref 19–32)
Chloride: 104 mEq/L (ref 96–112)
Creatinine, Ser: 0.86 mg/dL (ref 0.40–1.20)
GFR: 79.24 mL/min (ref >60.00)
Glucose, Bld: 105 mg/dL — ABNORMAL HIGH (ref 70–99)
POTASSIUM: 4.6 meq/L (ref 3.5–5.1)
Sodium: 138 mEq/L (ref 135–145)
TOTAL PROTEIN: 7.6 g/dL (ref 6.0–8.3)
Total Bilirubin: 0.2 mg/dL (ref 0.2–1.2)

## 2015-12-12 LAB — RENAL FUNCTION PANEL
Albumin: 4.2 g/dL (ref 3.5–5.2)
BUN: 12 mg/dL (ref 6–23)
CO2: 29 meq/L (ref 19–32)
CREATININE: 0.86 mg/dL (ref 0.40–1.20)
Calcium: 9.5 mg/dL (ref 8.4–10.5)
Chloride: 104 mEq/L (ref 96–112)
GFR: 79.24 mL/min (ref >60.00)
Glucose, Bld: 105 mg/dL — ABNORMAL HIGH (ref 70–99)
PHOSPHORUS: 3.4 mg/dL (ref 2.3–4.6)
Potassium: 4.6 mEq/L (ref 3.5–5.1)
Sodium: 138 mEq/L (ref 135–145)

## 2015-12-12 LAB — CBC
HCT: 36.8 % (ref 36.0–46.0)
HEMOGLOBIN: 11.7 g/dL — AB (ref 12.0–15.0)
MCHC: 32 g/dL (ref 30.0–36.0)
MCV: 75.4 fl — ABNORMAL LOW (ref 78.0–100.0)
PLATELETS: 282 10*3/uL (ref 150.0–400.0)
RBC: 4.87 Mil/uL (ref 3.87–5.11)
RDW: 14.6 % (ref 11.5–15.5)
WBC: 6.2 10*3/uL (ref 4.0–10.5)

## 2015-12-12 LAB — TSH: TSH: 1.16 u[IU]/mL (ref 0.35–4.50)

## 2015-12-12 LAB — LIPID PANEL
CHOL/HDL RATIO: 6
CHOLESTEROL: 214 mg/dL — AB (ref 0–200)
HDL: 35.7 mg/dL — AB (ref >39.00)
LDL Cholesterol: 151 mg/dL — ABNORMAL HIGH (ref 0–99)
NonHDL: 178.29
TRIGLYCERIDES: 136 mg/dL (ref 0.0–149.0)
VLDL: 27.2 mg/dL (ref 0.0–40.0)

## 2015-12-12 LAB — HEMOGLOBIN A1C: Hgb A1c MFr Bld: 5.6 % (ref 4.6–6.5)

## 2015-12-21 NOTE — Progress Notes (Signed)
Patient ID: Tara Stevenson, female   DOB: 02-Dec-1980, 35 y.o.   MRN: [id number]   Subjective:    Patient ID: Tara Stevenson, female    DOB: 1981/04/24, 35 y.o.   MRN: [id number]  Chief Complaint  Patient presents with  . Establish Care    HPI Patient is in today for new patient appointment. . No recent illness or acute concern. Her greatest concern is increasing anxiety and palpitations. She is having symptoms several times a day but most notably on work days in the afternoon. Feels shakey and weak when this occurs. She tried stopping the OCp but that did no helping. Only other concern is some mild allergies with some PND and slight head congestion. Denies CP/palp/SOB/HA/congestion/fevers/GI or GU c/o. Taking meds as prescribed. PMH only significant for mild allergies, anemia and anxiety.  Past Medical History  Diagnosis Date  . Heart murmur   . Anemia   . Thoracic spine pain   . Cervical radiculitis   . Lumbar spine pain   . Allergy   . Hx of typhoid fever     Past Surgical History  Procedure Laterality Date  . Hernia repair      2007  . Cesarean section      2005    Family History  Problem Relation Age of Onset  . Hypertension Mother   . Osteoarthritis Mother   . Hypertension Father   . Hyperlipidemia Father   . Diabetes Father   . Hypertension Maternal Grandmother   . Arthritis Maternal Grandmother   . Diabetes Maternal Grandfather   . Stroke Maternal Grandfather   . Hypertension Maternal Grandfather   . Hypertension Paternal Grandmother   . Diabetes Paternal Grandmother   . Cerebrovascular Accident Paternal Grandmother   . Hypertension Paternal Grandfather   . Cerebrovascular Accident Paternal Grandfather     Social History   Social History  . Marital Status: Married    Spouse Name: N/A  . Number of Children: 2  . Years of Education: BS degree   Occupational History  . Self employeed    Social History Main Topics  . Smoking status: Never Smoker   .  Smokeless tobacco: Never Used  . Alcohol Use: No  . Drug Use: No  . Sexual Activity: Yes   Other Topics Concern  . 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 Norethin Ace-Eth Estrad-FE (MINASTRIN 24 FE PO) Take by mouth.    . diazepam (VALIUM) 2 MG tablet 1 tablet by mouth 30 minutes prior to flight. (Patient not taking: Reported on 12/11/2015) 2 tablet 0   No facility-administered medications prior to visit.    No Known Allergies  Review of Systems  Constitutional: Negative for fever and malaise/fatigue.  HENT: Negative for congestion.   Eyes: Negative for blurred vision.  Respiratory: Negative for shortness of breath.   Cardiovascular: Positive for palpitations. Negative for chest pain and leg swelling.  Gastrointestinal: Negative for nausea, abdominal pain and blood in stool.  Genitourinary: Negative  for dysuria and frequency.  Musculoskeletal: Negative for falls.  Skin: Negative for rash.  Neurological: Negative for dizziness, tingling, loss of consciousness and headaches.  Endo/Heme/Allergies: Negative for environmental allergies.  Psychiatric/Behavioral: Positive for depression. The patient is nervous/anxious.        Objective:    Physical Exam  Constitutional: She is oriented to person, place, and time. She appears well-developed and well-nourished. No distress.  HENT:  Head: Normocephalic and atraumatic.  Eyes: Conjunctivae are normal.  Neck: Neck supple. No thyromegaly present.  Cardiovascular: Normal rate, regular rhythm and normal heart sounds.   No murmur heard. Pulmonary/Chest: Effort normal and breath sounds normal. No  respiratory distress.  Abdominal: Soft. Bowel sounds are normal. She exhibits no distension and no mass. There is no tenderness.  Musculoskeletal: She exhibits no edema.  Lymphadenopathy:    She has no cervical adenopathy.  Neurological: She is alert and oriented to person, place, and time.  Skin: Skin is warm and dry.  Psychiatric: She has a normal mood and affect. Her behavior is normal.    BP 110/68 mmHg  Pulse 85  Temp(Src) 98.5 F (36.9 C) (Oral)  Ht 5\' 3"  (1.6 m)  Wt 123 lb 6 oz (55.963 kg)  BMI 21.86 kg/m2  SpO2 99% Wt Readings from Last 3 Encounters:  12/11/15 123 lb 6 oz (55.963 kg)  06/25/15 119 lb 9.6 oz (54.25 kg)  11/08/14 120 lb (54.432 kg)     Lab Results  Component Value Date   WBC 6.2 12/11/2015   HGB 11.7* 12/11/2015   HCT 36.8 12/11/2015   PLT 282.0 12/11/2015   GLUCOSE 105* 12/11/2015   GLUCOSE 105* 12/11/2015   CHOL 214* 12/11/2015   TRIG 136.0 12/11/2015   HDL 35.70* 12/11/2015   LDLCALC 151* 12/11/2015   ALT 11 12/11/2015   AST 16 12/11/2015   NA 138 12/11/2015   NA 138 12/11/2015   K 4.6 12/11/2015   K 4.6 12/11/2015   CL 104 12/11/2015   CL 104 12/11/2015   CREATININE 0.86 12/11/2015   CREATININE 0.86 12/11/2015   BUN 12 12/11/2015   BUN 12 12/11/2015   CO2 29 12/11/2015   CO2 29 12/11/2015   TSH 1.16 12/11/2015   HGBA1C 5.6 12/12/2015    Lab Results  Component Value Date   TSH 1.16 12/11/2015   Lab Results  Component Value Date   WBC 6.2 12/11/2015   HGB 11.7* 12/11/2015   HCT 36.8 12/11/2015   MCV 75.4* 12/11/2015   PLT 282.0 12/11/2015   Lab Results  Component Value Date   NA 138 12/11/2015   NA 138 12/11/2015   K 4.6 12/11/2015   K 4.6 12/11/2015   CO2 29 12/11/2015   CO2 29 12/11/2015   GLUCOSE 105* 12/11/2015   GLUCOSE 105* 12/11/2015   BUN 12 12/11/2015   BUN 12 12/11/2015   CREATININE 0.86 12/11/2015   CREATININE 0.86 12/11/2015   BILITOT 0.2 12/11/2015   ALKPHOS 57 12/11/2015   AST 16 12/11/2015    ALT 11 12/11/2015   PROT 7.6 12/11/2015   ALBUMIN 4.2 12/11/2015   ALBUMIN 4.2 12/11/2015   CALCIUM 9.5 12/11/2015   CALCIUM 9.5 12/11/2015   GFR 79.24 12/11/2015   GFR 79.24 12/11/2015   Lab Results  Component Value Date   CHOL 214* 12/11/2015   Lab Results  Component Value Date   HDL 35.70* 12/11/2015   Lab Results  Component Value Date   LDLCALC 151* 12/11/2015   Lab Results  Component Value Date   TRIG 136.0 12/11/2015   Lab Results  Component Value Date   CHOLHDL 6 12/11/2015   Lab Results  Component Value Date   HGBA1C 5.6 12/12/2015       Assessment & Plan:   Problem List Items Addressed This Visit    Anxiety state    Has found herself struggling with daily episodes of intense anxiety and even panicky moments at times. Even just getting stuck in traffic. She is given Diazepam to use prn for now and encouraged to start counseling and/or SSRI. She will return in 3 months to reevauate or sooner as needed      Allergy    Is encouraged daily antihistamines and nasal steroids and report if no improvements.        Other Visit Diagnoses    Preventative health care    -  Primary    Relevant Orders    CBC (Completed)    TSH (Completed)    Renal function panel (Completed)    Lipid panel (Completed)    Comprehensive metabolic panel (Completed)    Dizziness and giddiness        Relevant Orders    CBC (Completed)    TSH (Completed)    Renal function panel (Completed)    Lipid panel (Completed)    Comprehensive metabolic panel (Completed)    Tremulousness        Relevant Orders    CBC (Completed)    TSH (Completed)    Renal function panel (Completed)    Lipid panel (Completed)    Comprehensive metabolic panel (Completed)    Urinary frequency        Relevant Orders    Urinalysis (Completed)       I am having Ms. Bleiler maintain her Norethin Ace-Eth Estrad-FE (MINASTRIN 24 FE PO) and diazepam.  No orders of the defined types were placed in this  encounter.     Danise Edge, MD

## 2015-12-21 NOTE — Assessment & Plan Note (Signed)
Is encouraged daily antihistamines and nasal steroids and report if no improvements.

## 2015-12-21 NOTE — Assessment & Plan Note (Signed)
Has found herself struggling with daily episodes of intense anxiety and even panicky moments at times. Even just getting stuck in traffic. She is given Diazepam to use prn for now and encouraged to start counseling and/or SSRI. She will return in 3 months to reevauate or sooner as needed

## 2016-04-12 ENCOUNTER — Ambulatory Visit: Payer: BLUE CROSS/BLUE SHIELD | Admitting: Family Medicine

## 2016-04-15 ENCOUNTER — Ambulatory Visit (INDEPENDENT_AMBULATORY_CARE_PROVIDER_SITE_OTHER): Payer: BLUE CROSS/BLUE SHIELD | Admitting: Family Medicine

## 2016-04-15 ENCOUNTER — Encounter: Payer: Self-pay | Admitting: Family Medicine

## 2016-04-15 DIAGNOSIS — E785 Hyperlipidemia, unspecified: Secondary | ICD-10-CM | POA: Diagnosis not present

## 2016-04-15 DIAGNOSIS — M25512 Pain in left shoulder: Secondary | ICD-10-CM

## 2016-04-15 DIAGNOSIS — F411 Generalized anxiety disorder: Secondary | ICD-10-CM | POA: Diagnosis not present

## 2016-04-15 DIAGNOSIS — D509 Iron deficiency anemia, unspecified: Secondary | ICD-10-CM

## 2016-04-15 DIAGNOSIS — R739 Hyperglycemia, unspecified: Secondary | ICD-10-CM

## 2016-04-15 HISTORY — DX: Hyperglycemia, unspecified: R73.9

## 2016-04-15 HISTORY — DX: Pain in left shoulder: M25.512

## 2016-04-15 HISTORY — DX: Hyperlipidemia, unspecified: E78.5

## 2016-04-15 MED ORDER — ALPRAZOLAM 0.25 MG PO TABS: 0.2500 mg | ORAL_TABLET | Freq: Two times a day (BID) | ORAL | 1 refills | Status: DC | PRN

## 2016-04-15 NOTE — Assessment & Plan Note (Signed)
Increase leafy greens, consider increased lean red meat and using cast iron cookware. Continue to monitor, report any concerns 

## 2016-04-15 NOTE — Progress Notes (Signed)
Patient ID: Tara Stevenson, female   DOB: Nov 19, 1980, 35 y.o.   MRN: [id number]   Subjective:    Patient ID: Tara Stevenson, female    DOB: Dec 13, 1980, 35 y.o.   MRN: [id number]  Chief Complaint  Patient presents with  . Follow-up    HPI Patient is in today for follow up with numerous concerns. Her husband has been gone recently with trip out of country so she is feeling more anxious and staying with her mother. She describes episodes of panic attacks a couple times a week with palpitations, sob, a sense of doom, tingling and cold hands and feet. Has not taken any meds for this and eventually it goes away. She even notes her vision blurring at times. She has increased left shoulder na neck pain, no falls or injury and in past when she had this pain PT was helpful. Has a HA frequently on top of head and recently her bowels have begun to move only once daily down from twice, not hard, no diarrhea, bloody or tarry stool. Denies CP/congestion/fevers or GU c/o. Taking meds as prescribed  Past Medical History:  Diagnosis Date  . Allergy   . Anemia   . Cervical radiculitis   . Heart murmur   . Hx of typhoid fever   . Hyperglycemia 04/15/2016  . Hyperlipidemia, mild 04/15/2016  . Left shoulder pain 04/15/2016  . Lumbar spine pain   . Thoracic spine pain     Past Surgical History:  Procedure Laterality Date  . CESAREAN SECTION     2005  . HERNIA REPAIR     2007    Family History  Problem Relation Age of Onset  . Hypertension Mother   . Osteoarthritis Mother   . Hypertension Father   . Hyperlipidemia Father   . Diabetes Father   . Hypertension Maternal Grandmother   . Arthritis Maternal Grandmother   . Diabetes Maternal Grandfather   . Stroke Maternal Grandfather   . Hypertension Maternal Grandfather   . Hypertension Paternal Grandmother   . Diabetes Paternal Grandmother   . Cerebrovascular Accident Paternal Grandmother   . Hypertension Paternal Grandfather   . Cerebrovascular  Accident Paternal Grandfather     Social History   Social History  . Marital status: Married    Spouse name: N/A  . Number of children: 2  . Years of education: BS degree   Occupational History  . Self employeed    Social History Main Topics  . Smoking status: Never Smoker  . Smokeless tobacco: Never Used  . Alcohol use No  . Drug use: No  . Sexual activity: Yes   Other Topics Concern  . 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 diazepam (VALIUM) 2 MG tablet 1 tablet by mouth 30 minutes prior to flight. (Patient not taking: Reported on 12/11/2015) 2 tablet 0  . Norethin Ace-Eth Estrad-FE (MINASTRIN 24 FE PO) Take by mouth.     No facility-administered medications prior to visit.  No Known Allergies  Review of Systems  Constitutional: Negative for fever and malaise/fatigue.  HENT: Negative for congestion.   Eyes: Negative for blurred vision.  Respiratory: Positive for shortness of breath.   Cardiovascular: Positive for palpitations. Negative for chest pain and leg swelling.  Gastrointestinal: Positive for constipation. Negative for abdominal pain, blood in stool and nausea.  Genitourinary: Negative for dysuria and frequency.  Musculoskeletal: Positive for back pain, myalgias and neck pain. Negative for falls.  Skin: Negative for rash.  Neurological: Negative for dizziness, loss of consciousness and headaches.  Endo/Heme/Allergies: Negative for environmental allergies.  Psychiatric/Behavioral: Positive for depression. The patient is nervous/anxious and has insomnia.        Objective:    Physical Exam  Constitutional: She is  oriented to person, place, and time. She appears well-developed and well-nourished. No distress.  HENT:  Head: Normocephalic and atraumatic.  Nose: Nose normal.  Eyes: Right eye exhibits no discharge. Left eye exhibits no discharge.  Neck: Normal range of motion. Neck supple.  Cardiovascular: Normal rate and regular rhythm.   No murmur heard. Pulmonary/Chest: Effort normal and breath sounds normal.  Abdominal: Soft. Bowel sounds are normal. There is no tenderness.  Musculoskeletal: She exhibits no edema.  Muscle spasm noted along left SCM muscle  Neurological: She is alert and oriented to person, place, and time.  Skin: Skin is warm and dry.  Psychiatric: She has a normal mood and affect.  Nursing note and vitals reviewed.   BP 125/78 (BP Location: Right Arm, Patient Position: Sitting, Cuff Size: Normal)   Pulse 81   Temp 98.5 F (36.9 C) (Oral)   Wt 120 lb 6.4 oz (54.6 kg)   LMP 04/15/2016   SpO2 100%   BMI 21.33 kg/m  Wt Readings from Last 3 Encounters:  04/15/16 120 lb 6.4 oz (54.6 kg)  12/11/15 123 lb 6 oz (56 kg)  06/25/15 119 lb 9.6 oz (54.3 kg)     Lab Results  Component Value Date   WBC 6.2 12/11/2015   HGB 11.7 (L) 12/11/2015   HCT 36.8 12/11/2015   PLT 282.0 12/11/2015   GLUCOSE 105 (H) 12/11/2015   GLUCOSE 105 (H) 12/11/2015   CHOL 214 (H) 12/11/2015   TRIG 136.0 12/11/2015   HDL 35.70 (L) 12/11/2015   LDLCALC 151 (H) 12/11/2015   ALT 11 12/11/2015   AST 16 12/11/2015   NA 138 12/11/2015   NA 138 12/11/2015   K 4.6 12/11/2015   K 4.6 12/11/2015   CL 104 12/11/2015   CL 104 12/11/2015   CREATININE 0.86 12/11/2015   CREATININE 0.86 12/11/2015   BUN 12 12/11/2015   BUN 12 12/11/2015   CO2 29 12/11/2015   CO2 29 12/11/2015   TSH 1.16 12/11/2015   HGBA1C 5.6 12/12/2015    Lab Results  Component Value Date   TSH 1.16 12/11/2015   Lab Results  Component Value Date   WBC 6.2 12/11/2015   HGB 11.7 (L) 12/11/2015   HCT 36.8 12/11/2015   MCV  75.4 (L) 12/11/2015   PLT 282.0 12/11/2015   Lab Results  Component Value Date   NA 138 12/11/2015   NA 138 12/11/2015   K 4.6 12/11/2015   K 4.6 12/11/2015   CO2 29 12/11/2015   CO2 29 12/11/2015   GLUCOSE 105 (H) 12/11/2015   GLUCOSE 105 (H) 12/11/2015   BUN 12 12/11/2015   BUN 12 12/11/2015   CREATININE 0.86 12/11/2015   CREATININE 0.86 12/11/2015  BILITOT 0.2 12/11/2015   ALKPHOS 57 12/11/2015   AST 16 12/11/2015   ALT 11 12/11/2015   PROT 7.6 12/11/2015   ALBUMIN 4.2 12/11/2015   ALBUMIN 4.2 12/11/2015   CALCIUM 9.5 12/11/2015   CALCIUM 9.5 12/11/2015   GFR 79.24 12/11/2015   GFR 79.24 12/11/2015   Lab Results  Component Value Date   CHOL 214 (H) 12/11/2015   Lab Results  Component Value Date   HDL 35.70 (L) 12/11/2015   Lab Results  Component Value Date   LDLCALC 151 (H) 12/11/2015   Lab Results  Component Value Date   TRIG 136.0 12/11/2015   Lab Results  Component Value Date   CHOLHDL 6 12/11/2015   Lab Results  Component Value Date   HGBA1C 5.6 12/12/2015       Assessment & Plan:   Problem List Items Addressed This Visit    Iron deficiency anemia, unspecified    Increase leafy greens, consider increased lean red meat and using cast iron cookware. Continue to monitor, report any concerns      Anxiety state    Chooses not to take daily ssri and never took any diazepam. Has tolerated Alprazolam in past given to her by her GYN will allow a refill to use infrequently for an anxiety attack. Advised to return to discuss daily meds if she needs it routinely.       Relevant Medications   ALPRAZolam (XANAX) 0.25 MG tablet   Left shoulder pain    Recurrent PT helped in past referred back, Encouraged moist heat and gentle stretching as tolerated. May try NSAIDs and prescription meds as directed and report if symptoms worsen or seek immediate care, try lidocaine patches and chiropractic care if no improvement      Relevant Orders   Ambulatory  referral to Physical Therapy   Hyperlipidemia, mild    Encouraged heart healthy diet, increase exercise, avoid trans fats, consider a krill oil cap daily      Hyperglycemia    hgba1c acceptable, minimize simple carbs. Increase exercise as tolerated.       Other Visit Diagnoses   None.     I have discontinued Ms. Brotzman's Norethin Ace-Eth Estrad-FE (MINASTRIN 24 FE PO) and diazepam. I am also having her start on ALPRAZolam. Additionally, I am having her maintain her MINASTRIN 24 FE.  Meds ordered this encounter  Medications  . MINASTRIN 24 FE 1-20 MG-MCG(24) CHEW    Sig: Chew 1 tablet by mouth daily.    Refill:  4  . ALPRAZolam (XANAX) 0.25 MG tablet    Sig: Take 1 tablet (0.25 mg total) by mouth 2 (two) times daily as needed for anxiety.    Dispense:  30 tablet    Refill:  1     Danise Edge, MD

## 2016-04-15 NOTE — Progress Notes (Signed)
Pre visit review using our clinic review tool, if applicable. No additional management support is needed unless otherwise documented below in the visit note. 

## 2016-04-15 NOTE — Assessment & Plan Note (Signed)
hgba1c acceptable, minimize simple carbs. Increase exercise as tolerated.  

## 2016-04-15 NOTE — Assessment & Plan Note (Signed)
Encouraged heart healthy diet, increase exercise, avoid trans fats, consider a krill oil cap daily 

## 2016-04-15 NOTE — Assessment & Plan Note (Addendum)
Recurrent PT helped in past referred back, Encouraged moist heat and gentle stretching as tolerated. May try NSAIDs and prescription meds as directed and report if symptoms worsen or seek immediate care, try lidocaine patches and chiropractic care if no improvement

## 2016-04-15 NOTE — Assessment & Plan Note (Addendum)
Chooses not to take daily ssri and never took any diazepam. Has tolerated Alprazolam in past given to her by her GYN will allow a refill to use infrequently for an anxiety attack. Advised to return to discuss daily meds if she needs it routinely.

## 2016-04-15 NOTE — Patient Instructions (Addendum)
64 oz of clear fluids daily Fiber such as Benefiber and psyllium/Metamucil once or twice a day  Lidocaine patches by aspercreme, salon pas or icy hot  [employer]cy Ward, chiropractor on [address]  [employer] company, [email or web address] Multivitamin with minerals qod  Constipation, Adult Constipation is when a person has fewer than three bowel movements a week, has difficulty having a bowel movement, or has stools that are dry, hard, or larger than normal. As people grow older, constipation is more common. A low-fiber diet, not taking in enough fluids, and taking certain medicines may make constipation worse.  CAUSES   Certain medicines, such as antidepressants, pain medicine, iron supplements, antacids, and water pills.   Certain diseases, such as diabetes, irritable bowel syndrome (IBS), thyroid disease, or depression.   Not drinking enough water.   Not eating enough fiber-rich foods.   Stress or travel.   Lack of physical activity or exercise.   Ignoring the urge to have a bowel movement.   Using laxatives too much.  SIGNS AND SYMPTOMS   Having fewer than three bowel movements a week.   Straining to have a bowel movement.   Having stools that are hard, dry, or larger than normal.   Feeling full or bloated.   Pain in the lower abdomen.   Not feeling relief after having a bowel movement.  DIAGNOSIS  Your health care provider will take a medical history and perform a physical exam. Further testing may be done for severe constipation. Some tests may include:  A barium enema X-ray to examine your rectum, colon, and, sometimes, your small intestine.   A sigmoidoscopy to examine your lower colon.   A colonoscopy to examine your entire colon. TREATMENT  Treatment will depend on the severity of your constipation and what is causing it. Some dietary treatments include drinking more fluids and eating more fiber-rich foods. Lifestyle treatments may include regular exercise.  If these diet and lifestyle recommendations do not help, your health care provider may recommend taking over-the-counter laxative medicines to help you have bowel movements. Prescription medicines may be prescribed if over-the-counter medicines do not work.  HOME CARE INSTRUCTIONS   Eat foods that have a lot of fiber, such as fruits, vegetables, whole grains, and beans.  Limit foods high in fat and processed sugars, such as french fries, hamburgers, cookies, candies, and soda.   A fiber supplement may be added to your diet if you cannot get enough fiber from foods.   Drink enough fluids to keep your urine clear or pale yellow.   Exercise regularly or as directed by your health care provider.   Go to the restroom when you have the urge to go. Do not hold it.   Only take over-the-counter or prescription medicines as directed by your health care provider. Do not take other medicines for constipation without talking to your health care provider first.  SEEK IMMEDIATE MEDICAL CARE IF:   You have bright red blood in your stool.   Your constipation lasts for more than 4 days or gets worse.   You have abdominal or rectal pain.   You have thin, pencil-like stools.   You have unexplained weight loss. MAKE SURE YOU:   Understand these instructions.  Will watch your condition.  Will get help right away if you are not doing well or get worse.   This information is not intended to replace advice given to you by your health care provider. Make sure you discuss any questions you have with  your health care provider.   Document Released: 04/02/2004 Document Revised: 07/26/2014 Document Reviewed: 04/16/2013 Elsevier Interactive Patient Education [employer].

## 2016-09-14 ENCOUNTER — Ambulatory Visit: Payer: BLUE CROSS/BLUE SHIELD | Admitting: Family Medicine

## 2016-11-04 ENCOUNTER — Telehealth: Payer: Self-pay | Admitting: Family Medicine

## 2016-11-04 ENCOUNTER — Ambulatory Visit (INDEPENDENT_AMBULATORY_CARE_PROVIDER_SITE_OTHER): Payer: BLUE CROSS/BLUE SHIELD | Admitting: Family Medicine

## 2016-11-04 ENCOUNTER — Encounter: Payer: Self-pay | Admitting: Family Medicine

## 2016-11-04 VITALS — BP 127/81 | HR 92 | Temp 98.4°F | Resp 16 | Ht 63.0 in | Wt 118.0 lb

## 2016-11-04 DIAGNOSIS — R739 Hyperglycemia, unspecified: Secondary | ICD-10-CM | POA: Diagnosis not present

## 2016-11-04 DIAGNOSIS — D509 Iron deficiency anemia, unspecified: Secondary | ICD-10-CM

## 2016-11-04 DIAGNOSIS — R42 Dizziness and giddiness: Secondary | ICD-10-CM

## 2016-11-04 DIAGNOSIS — E785 Hyperlipidemia, unspecified: Secondary | ICD-10-CM

## 2016-11-04 DIAGNOSIS — R000 Tachycardia, unspecified: Secondary | ICD-10-CM

## 2016-11-04 DIAGNOSIS — F411 Generalized anxiety disorder: Secondary | ICD-10-CM | POA: Diagnosis not present

## 2016-11-04 HISTORY — DX: Tachycardia, unspecified: R00.0

## 2016-11-04 HISTORY — DX: Dizziness and giddiness: R42

## 2016-11-04 LAB — CBC
HCT: 42.7 % (ref 36.0–46.0)
Hemoglobin: 14.2 g/dL (ref 12.0–15.0)
MCHC: 33.2 g/dL (ref 30.0–36.0)
MCV: 86.3 fl (ref 78.0–100.0)
PLATELETS: 241 10*3/uL (ref 150.0–400.0)
RBC: 4.95 Mil/uL (ref 3.87–5.11)
RDW: 13.8 % (ref 11.5–15.5)
WBC: 3.9 10*3/uL — ABNORMAL LOW (ref 4.0–10.5)

## 2016-11-04 LAB — LIPID PANEL
CHOL/HDL RATIO: 6
CHOLESTEROL: 210 mg/dL — AB (ref 0–200)
HDL: 37.8 mg/dL — AB (ref >39.00)
LDL Cholesterol: 143 mg/dL — ABNORMAL HIGH (ref 0–99)
NonHDL: 172.3
TRIGLYCERIDES: 146 mg/dL (ref 0.0–149.0)
VLDL: 29.2 mg/dL (ref 0.0–40.0)

## 2016-11-04 LAB — COMPREHENSIVE METABOLIC PANEL
ALK PHOS: 65 U/L (ref 39–117)
ALT: 16 U/L (ref 0–35)
AST: 17 U/L (ref 0–37)
Albumin: 4.1 g/dL (ref 3.5–5.2)
BILIRUBIN TOTAL: 0.4 mg/dL (ref 0.2–1.2)
BUN: 10 mg/dL (ref 6–23)
CO2: 27 mEq/L (ref 19–32)
CREATININE: 0.66 mg/dL (ref 0.40–1.20)
Calcium: 9.1 mg/dL (ref 8.4–10.5)
Chloride: 104 mEq/L (ref 96–112)
GFR: 107.01 mL/min (ref >60.00)
GLUCOSE: 74 mg/dL (ref 70–99)
Potassium: 3.8 mEq/L (ref 3.5–5.1)
Sodium: 137 mEq/L (ref 135–145)
TOTAL PROTEIN: 7.5 g/dL (ref 6.0–8.3)

## 2016-11-04 LAB — HEMOGLOBIN A1C: Hgb A1c MFr Bld: 5.5 % (ref 4.6–6.5)

## 2016-11-04 LAB — TSH: TSH: 1.01 u[IU]/mL (ref 0.35–4.50)

## 2016-11-04 MED ORDER — ALPRAZOLAM 0.25 MG PO TABS: 0.2500 mg | ORAL_TABLET | Freq: Two times a day (BID) | ORAL | 1 refills | Status: DC | PRN

## 2016-11-04 NOTE — Assessment & Plan Note (Addendum)
Continues to struggle with anxiety her son is 37 years old and she is stressed about him growing up. She has never taken a Alprazolam but she likes to know it is there. She is doing some soon so she would like to keep a refill on her

## 2016-11-04 NOTE — Assessment & Plan Note (Signed)
Likely anxiety but will proceed with TSH

## 2016-11-04 NOTE — Assessment & Plan Note (Signed)
Increase leafy greens, consider increased lean red meat and using cast iron cookware. Continue to monitor, report any concerns 

## 2016-11-04 NOTE — Telephone Encounter (Signed)
Patient was seen today by PCP and wanted to ensure medications please send to  \ [id number] - [place], [place] - [address] AT [address]OSS FROM [address] [phone] (Phone) [phone] (Fax)

## 2016-11-04 NOTE — Assessment & Plan Note (Signed)
Encouraged heart healthy diet, increase exercise, avoid trans fats, consider a krill oil cap daily 

## 2016-11-04 NOTE — Telephone Encounter (Signed)
Phoned in refill for Alprazolam

## 2016-11-04 NOTE — Assessment & Plan Note (Signed)
hgba1c acceptable, minimize simple carbs. Increase exercise as tolerated.  

## 2016-11-04 NOTE — Telephone Encounter (Signed)
She needed Alprazolam refill with same sig, #10 tabs with 1 rf sent to her pharmacy since she did not take it to her

## 2016-11-04 NOTE — Assessment & Plan Note (Signed)
Happens at some point most days but only lasts seconds. Encouraged to to drink 64 oz

## 2016-11-04 NOTE — Patient Instructions (Signed)
Drink up to 64 oz of water daily, and add protein to diet daily to help with light headedness.  Dizziness Dizziness is a common problem. It is a feeling of unsteadiness or light-headedness. You may feel like you are about to faint. Dizziness can lead to injury if you stumble or fall. Anyone can become dizzy, but dizziness is more common in older adults. This condition can be caused by a number of things, including medicines, dehydration, or illness. Follow these instructions at home: Taking these steps may help with your condition: Eating and drinking   Drink enough fluid to keep your urine clear or pale yellow. This helps to keep you from becoming dehydrated. Try to drink more clear fluids, such as water.  Do not drink alcohol.  Limit your caffeine intake if directed by your health care provider.  Limit your salt intake if directed by your health care provider. Activity   Avoid making quick movements.  Rise slowly from chairs and steady yourself until you feel okay.  In the morning, first sit up on the side of the bed. When you feel okay, stand slowly while you hold onto something until you know that your balance is fine.  Move your legs often if you need to stand in one place for a long time. Tighten and relax your muscles in your legs while you are standing.  Do not drive or operate heavy machinery if you feel dizzy.  Avoid bending down if you feel dizzy. Place items in your home so that they are easy for you to reach without leaning over. Lifestyle   Do not use any tobacco products, including cigarettes, chewing tobacco, or electronic cigarettes. If you need help quitting, ask your health care provider.  Try to reduce your stress level, such as with yoga or meditation. Talk with your health care provider if you need help. General instructions   Watch your dizziness for any changes.  Take medicines only as directed by your health care provider. Talk with your health care  provider if you think that your dizziness is caused by a medicine that you are taking.  Tell a friend or a family member that you are feeling dizzy. If he or she notices any changes in your behavior, have this person call your health care provider.  Keep all follow-up visits as directed by your health care provider. This is important. Contact a health care provider if:  Your dizziness does not go away.  Your dizziness or light-headedness gets worse.  You feel nauseous.  You have reduced hearing.  You have new symptoms.  You are unsteady on your feet or you feel like the room is spinning. Get help right away if:  You vomit or have diarrhea and are unable to eat or drink anything.  You have problems talking, walking, swallowing, or using your arms, hands, or legs.  You feel generally weak.  You are not thinking clearly or you have trouble forming sentences. It may take a friend or family member to notice this.  You have chest pain, abdominal pain, shortness of breath, or sweating.  Your vision changes.  You notice any bleeding.  You have a headache.  You have neck pain or a stiff neck.  You have a fever. This information is not intended to replace advice given to you by your health care provider. Make sure you discuss any questions you have with your health care provider. Document Released: 12/29/2000 Document Revised: 12/11/2015 Document Reviewed: 07/01/2014 Elsevier Interactive  Patient Education  2017 Elsevier Inc.  

## 2016-11-04 NOTE — Progress Notes (Signed)
Subjective:  I acted as a Neurosurgeon for Dr. Eliseo Squires, LPN    Patient ID: Tara Stevenson, female    DOB: 05-Nov-1980, 36 y.o.   MRN: [id number]  Chief Complaint  Patient presents with  . Anxiety    follow up  . Hyperglycemia    follow up  . Hyperlipidemia    follow up    Hyperlipidemia  This is a chronic problem. The current episode started more than 1 year ago. Pertinent negatives include no chest pain.    Patient is in today for follow up hyperlipidemia, hyperglycemia, and anxiety. Pt would like to speak to provider about her anxiety. Pt report she get light headed at times, she report she notice it happens right after a meal. Pt report the light headed episodes happens almost every day.  Pt report she does not take much of her medication Xanax, but just to know that the medication is there makes her feel better. Denies CP/palp/SOB/HA/congestion/fevers/GI or GU c/o. Taking meds as prescribed  Patient Care Team: Bradd Canary, MD as PCP - General (Family Medicine) Waynard Reeds, MD as Consulting Physician (Obstetrics and Gynecology)   Past Medical History:  Diagnosis Date  . Allergy   . Anemia   . Cervical radiculitis   . Heart murmur   . Hx of typhoid fever   . Hyperglycemia 04/15/2016  . Hyperlipidemia, mild 04/15/2016  . Left shoulder pain 04/15/2016  . Lightheadedness 11/04/2016  . Lumbar spine pain   . Tachycardia 11/04/2016  . Thoracic spine pain     Past Surgical History:  Procedure Laterality Date  . CESAREAN SECTION     2005  . HERNIA REPAIR     2007    Family History  Problem Relation Age of Onset  . Hypertension Mother   . Osteoarthritis Mother   . Hypertension Father   . Hyperlipidemia Father   . Diabetes Father   . Hypertension Maternal Grandmother   . Arthritis Maternal Grandmother   . Diabetes Maternal Grandfather   . Stroke Maternal Grandfather   . Hypertension Maternal Grandfather   . Hypertension Paternal Grandmother   . Diabetes Paternal  Grandmother   . Cerebrovascular Accident Paternal Grandmother   . Hypertension Paternal Grandfather   . Cerebrovascular Accident Paternal Grandfather     Social History   Social History  . Marital status: Married    Spouse name: N/A  . Number of children: 2  . Years of education: BS degree   Occupational History  . Self employeed    Social History Main Topics  . Smoking status: Never Smoker  . Smokeless tobacco: Never Used  . Alcohol use No  . Drug use: No  . Sexual activity: Yes   Other Topics Concern  . 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 MINASTRIN 24 FE 1-20 MG-MCG(24) CHEW Chew 1 tablet by mouth daily.  4  . ALPRAZolam (XANAX) 0.25 MG tablet Take 1 tablet (0.25 mg total) by mouth 2 (two) times  daily as needed for anxiety. 30 tablet 1   No facility-administered medications prior to visit.     No Known Allergies  Review of Systems  Constitutional: Negative for fever.  HENT: Negative for congestion.   Eyes: Negative for blurred vision.  Respiratory: Negative for cough.   Cardiovascular: Negative for chest pain and palpitations.  Gastrointestinal: Negative for vomiting.  Musculoskeletal: Negative for back pain.  Skin: Negative for rash.  Neurological: Positive for dizziness. Negative for loss of consciousness and headaches.  Psychiatric/Behavioral: The patient is nervous/anxious.        Objective:    Physical Exam  Constitutional: She is oriented to person, place, and time. She appears well-developed and well-nourished. No distress.  HENT:  Head: Normocephalic and atraumatic.  Eyes: Conjunctivae are normal. Pupils  are equal, round, and reactive to light.  Neck: Normal range of motion. No thyromegaly present.  Cardiovascular: Regular rhythm.  Tachycardia present.   Pulmonary/Chest: Effort normal and breath sounds normal. She has no wheezes.  Abdominal: Soft. Bowel sounds are normal. There is no tenderness.  Musculoskeletal: Normal range of motion. She exhibits no edema or deformity.  Neurological: She is alert and oriented to person, place, and time.  Skin: Skin is warm and dry. She is not diaphoretic.  Psychiatric: She has a normal mood and affect.    BP 127/81 (BP Location: Right Arm, Patient Position: Sitting, Cuff Size: Normal)   Pulse 92   Temp 98.4 F (36.9 C) (Oral)   Resp 16   Ht  (1.6 m)   Wt 118 lb (53.5 kg)   LMP 10/05/2016 Comment: pt report she is on birth control and she does not get a period all the time  SpO2 100%   BMI 20.90 kg/m  Wt Readings from Last 3 Encounters:  11/04/16 118 lb (53.5 kg)  04/15/16 120 lb 6.4 oz (54.6 kg)  12/11/15 123 lb 6 oz (56 kg)   BP Readings from Last 3 Encounters:  11/04/16 127/81  04/15/16 125/78  12/11/15 110/68      There is no immunization history on file for this patient.  Health Maintenance  Topic Date Due  . HIV Screening  09/05/1994  . TETANUS/TDAP  09/05/1998  . INFLUENZA VACCINE  02/16/2017  . PAP SMEAR  08/19/2018    Lab Results  Component Value Date   WBC 3.9 (L) 11/04/2016   HGB 14.2 11/04/2016   HCT 42.7 11/04/2016   PLT 241.0 11/04/2016   GLUCOSE 74 11/04/2016   CHOL 210 (H) 11/04/2016   TRIG 146.0 11/04/2016   HDL 37.80 (L) 11/04/2016   LDLCALC 143 (H) 11/04/2016   ALT 16 11/04/2016   AST 17 11/04/2016   NA 137 11/04/2016   K 3.8 11/04/2016   CL 104 11/04/2016   CREATININE 0.66 11/04/2016   BUN 10 11/04/2016   CO2 27 11/04/2016   TSH 1.01 11/04/2016   HGBA1C 5.5 11/04/2016    Lab Results  Component Value Date   TSH 1.01 11/04/2016   Lab Results  Component Value Date   WBC 3.9 (L) 11/04/2016    HGB 14.2 11/04/2016   HCT 42.7 11/04/2016   MCV 86.3 11/04/2016   PLT 241.0 11/04/2016   Lab Results  Component Value Date   NA 137 11/04/2016   K 3.8 11/04/2016   CO2 27 11/04/2016   GLUCOSE 74 11/04/2016   BUN 10 11/04/2016   CREATININE 0.66 11/04/2016   BILITOT 0.4 11/04/2016   ALKPHOS 65 11/04/2016  AST 17 11/04/2016   ALT 16 11/04/2016   PROT 7.5 11/04/2016   ALBUMIN 4.1 11/04/2016   CALCIUM 9.1 11/04/2016   GFR 107.01 11/04/2016   Lab Results  Component Value Date   CHOL 210 (H) 11/04/2016   Lab Results  Component Value Date   HDL 37.80 (L) 11/04/2016   Lab Results  Component Value Date   LDLCALC 143 (H) 11/04/2016   Lab Results  Component Value Date   TRIG 146.0 11/04/2016   Lab Results  Component Value Date   CHOLHDL 6 11/04/2016   Lab Results  Component Value Date   HGBA1C 5.5 11/04/2016         Assessment & Plan:   Problem List Items Addressed This Visit    Iron deficiency anemia, unspecified    Increase leafy greens, consider increased lean red meat and using cast iron cookware. Continue to monitor, report any concerns      Relevant Orders   Comprehensive metabolic panel (Completed)   CBC (Completed)   Anxiety state - Primary    Continues to struggle with anxiety her son is 51 years old and she is stressed about him growing up. She has never taken a Alprazolam but she likes to know it is there. She is doing some soon so she would like to keep a refill on her      Hyperlipidemia, mild    Encouraged heart healthy diet, increase exercise, avoid trans fats, consider a krill oil cap daily      Relevant Orders   Comprehensive metabolic panel (Completed)   Lipid panel (Completed)   Hyperglycemia    hgba1c acceptable, minimize simple carbs. Increase exercise as tolerated.       Relevant Orders   Hemoglobin A1c (Completed)   Tachycardia    Likely anxiety but will proceed with TSH      Lightheadedness    Happens at some point most  days but only lasts seconds. Encouraged to to drink 64 oz       Relevant Orders   TSH (Completed)      I am having Ms. Kreeger maintain her MINASTRIN 24 FE.  No orders of the defined types were placed in this encounter.   CMA served as Neurosurgeon during this visit. History, Physical and Plan performed by medical provider. Documentation and orders reviewed and attested to.  Danise Edge, MD  Patient ID: Tara Stevenson, female   DOB: [birth year], 36 y.o.   MRN: [id number]

## 2016-11-04 NOTE — Progress Notes (Signed)
Pre visit review using our clinic review tool, if applicable. No additional management support is needed unless otherwise documented below in the visit note. 

## 2017-01-20 ENCOUNTER — Encounter: Payer: Self-pay | Admitting: Family Medicine

## 2017-01-20 ENCOUNTER — Ambulatory Visit (INDEPENDENT_AMBULATORY_CARE_PROVIDER_SITE_OTHER): Payer: BLUE CROSS/BLUE SHIELD | Admitting: Family Medicine

## 2017-01-20 VITALS — BP 100/66 | HR 75 | Temp 98.2°F | Resp 16 | Ht 63.0 in | Wt 111.0 lb

## 2017-01-20 DIAGNOSIS — D509 Iron deficiency anemia, unspecified: Secondary | ICD-10-CM

## 2017-01-20 DIAGNOSIS — F411 Generalized anxiety disorder: Secondary | ICD-10-CM

## 2017-01-20 DIAGNOSIS — R634 Abnormal weight loss: Secondary | ICD-10-CM

## 2017-01-20 DIAGNOSIS — R739 Hyperglycemia, unspecified: Secondary | ICD-10-CM | POA: Diagnosis not present

## 2017-01-20 NOTE — Progress Notes (Signed)
Patient ID: Tara Stevenson, female   DOB: [birth year], 36 y.o.   MRN: [id number]     Subjective:  I acted as Stevenson Neurosurgeonscribe for Dr. Abner GreenspanBlyth.  Tara Stevenson, CMA.   Patient ID: Tara Stevenson, female    DOB: [birth year], 36 y.o.   MRN: [id number]  Chief Complaint  Patient presents with  . Anxiety  . Weight Loss    HPI  Patient is in today for anxiety and weight loss.   Her hair has been falling out.  She had some concerns with having Stevenson period from 12/11/16 til 01/15/17.  She saw her gyn and she has since stopped her birth control pill.  Now she has been worried that something is going on. She feels she is less anxious since stopping her OCPs. She started the [facility] for heavy bleeding and so far has had no trouble since stopping. She thinks her anxiety has been triggered by recent visit home where she saw her ailing grandma with whom she was very close. Since then she's felt unsettled had episodes of palpitations and anxiety attacks. No recent febrile illness or hospitalizations. Denies CP/SOB/HA/congestion/fevers/GI or GU c/o. Taking meds as prescribed  Patient Care Team: Tara Stevenson, Tara A, MD as PCP - General (Family Medicine) Tara Stevenson, Kendra, MD as Consulting Physician (Obstetrics and Gynecology)   Past Medical History:  Diagnosis Date  . Allergy   . Anemia   . Cervical radiculitis   . Heart murmur   . Hx of typhoid fever   . Hyperglycemia 04/15/2016  . Hyperlipidemia, mild 04/15/2016  . Left shoulder pain 04/15/2016  . Lightheadedness 11/04/2016  . Lumbar spine pain   . Tachycardia 11/04/2016  . Thoracic spine pain     Past Surgical History:  Procedure Laterality Date  . CESAREAN SECTION     2005  . HERNIA REPAIR     2007    Family History  Problem Relation Age of Onset  . Hypertension Mother   . Osteoarthritis Mother   . Hypertension Father   . Hyperlipidemia Father   . Diabetes Father   . Hypertension Maternal Grandmother   . Arthritis Maternal Grandmother   . Diabetes Maternal Grandfather    . Stroke Maternal Grandfather   . Hypertension Maternal Grandfather   . Hypertension Paternal Grandmother   . Diabetes Paternal Grandmother   . Cerebrovascular Accident Paternal Grandmother   . Hypertension Paternal Grandfather   . Cerebrovascular Accident Paternal Grandfather     Social History   Social History  . Marital status: Married    Spouse name: N/Stevenson  . Number of children: 2  . Years of education: BS degree   Occupational History  . Self employeed    Social History Main Topics  . Smoking status: Never Smoker  . Smokeless tobacco: Never Used  . Alcohol use No  . Drug use: No  . Sexual activity: Yes   Other Topics Concern  . 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 ALPRAZolam (XANAX) 0.25 MG tablet  Take 1 tablet (0.25 mg total) by mouth 2 (two) times daily as needed for anxiety. 10 tablet 1  . MINASTRIN 24 FE 1-20 MG-MCG(24) CHEW Chew 1 tablet by mouth daily.  4   No facility-administered medications prior to visit.     No Known Allergies  Review of Systems  Constitutional: Positive for weight loss. Negative for fever and malaise/fatigue.       Hair loss.  HENT: Negative for congestion.   Eyes: Negative for blurred vision.  Respiratory: Negative for cough and shortness of breath.   Cardiovascular: Positive for palpitations. Negative for chest pain and leg swelling.  Gastrointestinal: Negative for vomiting.  Musculoskeletal: Negative for back pain.  Skin: Negative for rash.  Neurological: Negative for loss of consciousness and headaches.  Psychiatric/Behavioral: The patient is nervous/anxious.        Anxiety         Objective:    Physical Exam  Constitutional: She is oriented to person, place, and time. She appears well-developed and well-nourished. No distress.  HENT:  Head: Normocephalic and atraumatic.  Eyes: Conjunctivae are normal.  Neck: Normal range of motion. No thyromegaly present.  Cardiovascular: Normal rate and regular rhythm.   Pulmonary/Chest: Effort normal and breath sounds normal. She has no wheezes.  Abdominal: Soft. Bowel sounds are normal. There is no tenderness.  Musculoskeletal: Normal range of motion. She exhibits no edema or deformity.  Neurological: She is alert and oriented to person, place, and time.  Skin: Skin is warm and dry. She is not diaphoretic.  Psychiatric: She has Stevenson normal mood and affect.    BP 100/66 (BP Location: Left Arm, Cuff Size: Normal)   Pulse 75   Temp 98.2 F (36.8 C) (Oral)   Resp 16   Ht 5\' 3"  (1.6 m)   Wt 111 lb (50.3 kg)   LMP 12/11/2016 Comment: had Stevenson period from 12/11/16-01/15/17  SpO2 98%   BMI 19.66 kg/m  Wt Readings from Last 3 Encounters:  01/20/17 111 lb (50.3 kg)  11/04/16 118 lb (53.5 kg)  04/15/16 120 lb 6.4 oz (54.6 kg)   BP Readings from Last 3 Encounters:  01/20/17 100/66  11/04/16 127/81  04/15/16 125/78      There is no immunization history on file for this patient.  Health Maintenance  Topic Date Due  . HIV Screening  09/05/1994  . TETANUS/TDAP  09/05/1998  . INFLUENZA VACCINE  02/16/2017  . PAP SMEAR  08/19/2018    Lab Results  Component Value Date   WBC 8.3 01/20/2017   HGB 13.9 01/20/2017   HCT 41.8 01/20/2017   PLT 280.0 01/20/2017   GLUCOSE 89 01/20/2017   CHOL 210 (H) 11/04/2016   TRIG 146.0 11/04/2016   HDL 37.80 (L) 11/04/2016   LDLCALC 143 (H) 11/04/2016   ALT 19 01/20/2017   AST 17 01/20/2017   NA 138 01/20/2017   K 4.4 01/20/2017   CL 102 01/20/2017   CREATININE 0.86 01/20/2017   BUN 17 01/20/2017   CO2 30 01/20/2017   TSH 0.91 01/20/2017   HGBA1C 5.5 11/04/2016    Lab Results   Component Value Date   TSH 0.91 01/20/2017   Lab Results  Component Value Date   WBC 8.3 01/20/2017   HGB 13.9 01/20/2017   HCT 41.8 01/20/2017   MCV 87.1 01/20/2017   PLT 280.0 01/20/2017   Lab Results  Component Value Date   NA 138 01/20/2017   K 4.4 01/20/2017   CO2 30 01/20/2017  GLUCOSE 89 01/20/2017   BUN 17 01/20/2017   CREATININE 0.86 01/20/2017   BILITOT 0.3 01/20/2017   ALKPHOS 66 01/20/2017   AST 17 01/20/2017   ALT 19 01/20/2017   PROT 7.4 01/20/2017   ALBUMIN 4.0 01/20/2017   CALCIUM 9.4 01/20/2017   GFR 78.75 01/20/2017   Lab Results  Component Value Date   CHOL 210 (H) 11/04/2016   Lab Results  Component Value Date   HDL 37.80 (L) 11/04/2016   Lab Results  Component Value Date   LDLCALC 143 (H) 11/04/2016   Lab Results  Component Value Date   TRIG 146.0 11/04/2016   Lab Results  Component Value Date   CHOLHDL 6 11/04/2016   Lab Results  Component Value Date   HGBA1C 5.5 11/04/2016         Assessment & Plan:   Problem List Items Addressed This Visit    Iron deficiency anemia, unspecified    resolved      Anxiety state    Has recently returned home from [place] visiting family. She has Stevenson grandmother she is very close to who is older and failing. She is very sad and realizes that se may not see her Grandmother again. She thinks that has caused some increased anxiety and anorexia. She declines Stevenson daily med but will consider counseling if symptoms persist. Is allowed Stevenson small amount of Alprazolam to use prn very anxiety and she will let us know if any new concerns develop. She felt her OCP which she took to regulate her heavy menstrual cycles was contributing so she stopped it and she reports she actually feels Stevenson bit better.       Hyperglycemia    Glucose good today       Other Visit Diagnoses    Weight loss    -  Primary   Relevant Orders   TSH (Completed)   Comprehensive metabolic panel (Completed)   CBC (Completed)      I  have discontinued Ms. Traywick's MINASTRIN 24 FE. I am also having her maintain her ALPRAZolam.  No orders of the defined types were placed in this encounter.   CMA served as Neurosurgeon during this visit. History, Physical and Plan performed by medical provider. Documentation and orders reviewed and attested to.  Danise Edge, MD

## 2017-01-20 NOTE — Patient Instructions (Addendum)
Sugar is pro inflammatory so  64 oz clear fluid  Flonase at night Allergies, Adult An allergy is when your body's defense system (immune system) overreacts to an otherwise harmless substance (allergen) that you breathe in or eat or something that touches your skin. When you come into contact with something that you are allergic to, your immune system produces certain proteins (antibodies). These proteins cause cells to release chemicals (histamines) that trigger the symptoms of an allergic reaction. Allergies often affect the nasal passages (allergic rhinitis), eyes (allergic conjunctivitis), skin (atopic dermatitis), and stomach. Allergies can be mild or severe. Allergies cannot spread from person to person (are not contagious). They can develop at any age and may be outgrown. What increases the risk? You may be at greater risk of allergies if other people in your family have allergies. What are the signs or symptoms? Symptoms depend on what type of allergy you have. They may include:  Runny, stuffy nose.  Sneezing.  Itchy mouth, ears, or throat.  Postnasal drip.  Sore throat.  Itchy, red, watery, or puffy eyes.  Skin rash or hives.  Stomach pain.  Vomiting.  Diarrhea.  Bloating.  Wheezing or coughing.  People with a severe allergy to food, medicine, or an insect bite may have a life-threatening allergic reaction (anaphylaxis). Symptoms of anaphylaxis include:  Hives.  Itching.  Flushed face.  Swollen lips, tongue, or mouth.  Tight or swollen throat.  Chest pain or tightness in the chest.  Trouble breathing or shortness of breath.  Rapid heartbeat.  Dizziness or fainting.  Vomiting.  Diarrhea.  Pain in the abdomen.  How is this diagnosed? This condition is diagnosed based on:  Your symptoms.  Your family and medical history.  A physical exam.  You may need to see a health care provider who specializes in treating allergies (allergist). You may  also have tests, including:  Skin tests to see which allergens are causing your symptoms, such as: ? Skin prick test. In this test, your skin is pricked with a tiny needle and exposed to small amounts of possible allergens to see if your skin reacts. ? Intradermal skin test. In this test, a small amount of allergen is injected under your skin to see if your skin reacts. ? Patch test. In this test, a small amount of allergen is placed on your skin and then your skin is covered with a bandage. Your health care provider will check your skin after a couple of days to see if a rash has developed.  Blood tests.  Challenges tests. In this test, you inhale a small amount of allergen by mouth to see if you have an allergic reaction.  You may also be asked to:  Keep a food diary. A food diary is a record of all the foods and drinks you have in a day and any symptoms you experience.  Practice an elimination diet. An elimination diet involves eliminating specific foods from your diet and then adding them back in one by one to find out if a certain food causes an allergic reaction.  How is this treated? Treatment for allergies depends on your symptoms. Treatment may include:  Cold compresses to soothe itching and swelling.  Eye drops.  Nasal sprays.  Using a saline spray or container (neti pot) to flush out the nose (nasal irrigation). These methods can help clear away mucus and keep the nasal passages moist.  Using a humidifier.  Oral antihistamines or other medicines to block allergic reaction  and inflammation.  Skin creams to treat rashes or itching.  Diet changes to eliminate food allergy triggers.  Repeated exposure to tiny amounts of allergens to build up a tolerance and prevent future allergic reactions (immunotherapy). These include: ? Allergy shots. ? Oral treatment. This involves taking small doses of an allergen under the tongue (sublingual immunotherapy).  Emergency epinephrine  injection (auto-injector) in case of an allergic emergency. This is a self-injectable, pre-measured medicine that must be given within the first few minutes of a serious allergic reaction.  Follow these instructions at home:  Avoid known allergens whenever possible.  If you suffer from airborne allergens, wash out your nose daily. You can do this with a saline spray or a neti pot to flush out your nose (nasal irrigation).  Take over-the-counter and prescription medicines only as told by your health care provider.  Keep all follow-up visits as told by your health care provider. This is important.  If you are at risk of a severe allergic reaction (anaphylaxis), keep your auto-injector with you at all times.  If you have ever had anaphylaxis, wear a medical alert bracelet or necklace that states you have a severe allergy. Contact a health care provider if:  Your symptoms do not improve with treatment. Get help right away if:  You have symptoms of anaphylaxis, such as: ? Swollen mouth, tongue, or throat. ? Pain or tightness in your chest. ? Trouble breathing or shortness of breath. ? Dizziness or fainting. ? Severe abdominal pain, vomiting, or diarrhea. This information is not intended to replace advice given to you by your health care provider. Make sure you discuss any questions you have with your health care provider. Document Released: 09/28/2002 Document Revised: 03/04/2016 Document Reviewed: 01/21/2016 Elsevier Interactive Patient Education  [employer]2018 Elsevier Inc.

## 2017-01-21 LAB — CBC
HCT: 41.8 % (ref 36.0–46.0)
Hemoglobin: 13.9 g/dL (ref 12.0–15.0)
MCHC: 33.3 g/dL (ref 30.0–36.0)
MCV: 87.1 fl (ref 78.0–100.0)
PLATELETS: 280 10*3/uL (ref 150.0–400.0)
RBC: 4.8 Mil/uL (ref 3.87–5.11)
RDW: 13.5 % (ref 11.5–15.5)
WBC: 8.3 10*3/uL (ref 4.0–10.5)

## 2017-01-21 LAB — COMPREHENSIVE METABOLIC PANEL
ALBUMIN: 4 g/dL (ref 3.5–5.2)
ALT: 19 U/L (ref 0–35)
AST: 17 U/L (ref 0–37)
Alkaline Phosphatase: 66 U/L (ref 39–117)
BILIRUBIN TOTAL: 0.3 mg/dL (ref 0.2–1.2)
BUN: 17 mg/dL (ref 6–23)
CALCIUM: 9.4 mg/dL (ref 8.4–10.5)
CO2: 30 mEq/L (ref 19–32)
CREATININE: 0.86 mg/dL (ref 0.40–1.20)
Chloride: 102 mEq/L (ref 96–112)
GFR: 78.75 mL/min (ref >60.00)
Glucose, Bld: 89 mg/dL (ref 70–99)
Potassium: 4.4 mEq/L (ref 3.5–5.1)
Sodium: 138 mEq/L (ref 135–145)
Total Protein: 7.4 g/dL (ref 6.0–8.3)

## 2017-01-21 LAB — TSH: TSH: 0.91 u[IU]/mL (ref 0.35–4.50)

## 2017-01-23 NOTE — Assessment & Plan Note (Addendum)
Has recently [place] home from [place]. She has a grandmother she is very close to who is older and failing. She is very sad and realizes that se may not see her Grandmother again. She thinks that has caused some increased anxiety and anorexia. She declines a daily med but will consider counseling if symptoms persist. Is allowed a small amount of Alprazolam to use prn very anxiety and she will let us know if any new concerns develop. She felt her OCP which she took to regulate her heavy menstrual cycles was contributing so she stopped it and she reports she actually feels a bit better. Spent 25 of 30 minute visit in counseling.

## 2017-01-23 NOTE — Assessment & Plan Note (Signed)
resolved 

## 2017-01-23 NOTE — Assessment & Plan Note (Signed)
Glucose good today

## 2017-03-01 ENCOUNTER — Encounter: Payer: Self-pay | Admitting: Family Medicine

## 2017-03-01 ENCOUNTER — Ambulatory Visit (INDEPENDENT_AMBULATORY_CARE_PROVIDER_SITE_OTHER): Payer: BLUE CROSS/BLUE SHIELD | Admitting: Family Medicine

## 2017-03-01 VITALS — BP 118/82 | HR 84 | Temp 98.6°F | Ht 63.0 in | Wt 110.0 lb

## 2017-03-01 DIAGNOSIS — M62830 Muscle spasm of back: Secondary | ICD-10-CM | POA: Diagnosis not present

## 2017-03-01 DIAGNOSIS — F411 Generalized anxiety disorder: Secondary | ICD-10-CM | POA: Diagnosis not present

## 2017-03-01 DIAGNOSIS — E785 Hyperlipidemia, unspecified: Secondary | ICD-10-CM

## 2017-03-01 DIAGNOSIS — R739 Hyperglycemia, unspecified: Secondary | ICD-10-CM | POA: Diagnosis not present

## 2017-03-01 DIAGNOSIS — R000 Tachycardia, unspecified: Secondary | ICD-10-CM | POA: Diagnosis not present

## 2017-03-01 NOTE — Assessment & Plan Note (Addendum)
Has not taken her Alprazolam at all but she feels she manages better since she stopped her OCP. She is considering counseling given [facility]. She will call if she decides to proceed

## 2017-03-01 NOTE — Progress Notes (Signed)
Patient ID: Tara Stevenson, female   DOB: 08-17-1980, 36 y.o.   MRN: [id number]   Subjective:    Patient ID: Tara Stevenson, female    DOB: July 19, 1981, 36 y.o.   MRN: [id number]  Chief Complaint  Patient presents with  . Follow-up    Patient is here today to F/U on unintentional weight-loss. At 7.5.18 visit she weighed 111 pounds and today weighs 110 pounds.    HPI Patient is in today for follow up. She continues to struggle with anxiety and worrying about things all day long. She has been worrying about her children, her parents and more. No recent febrile illness or hospitalizaitons. She has not had any febrile illness. She is noting some trouble with LLQ discomfort and is worried about her previous hernia repair failing. No bowel or bladder changes. Denies CP/palp/SOB/HA/congestion/fevers/GI or GU c/o. Taking meds as prescribed Past Medical History:  Diagnosis Date  . Allergy   . Anemia   . Cervical radiculitis   . Heart murmur   . Hx of typhoid fever   . Hyperglycemia 04/15/2016  . Hyperlipidemia, mild 04/15/2016  . Left shoulder pain 04/15/2016  . Lightheadedness 11/04/2016  . Lumbar spine pain   . Tachycardia 11/04/2016  . Thoracic spine pain     Past Surgical History:  Procedure Laterality Date  . CESAREAN SECTION     2005  . HERNIA REPAIR     2007    Family History  Problem Relation Age of Onset  . Hypertension Mother   . Osteoarthritis Mother   . Hypertension Father   . Hyperlipidemia Father   . Diabetes Father   . Hypertension Maternal Grandmother   . Arthritis Maternal Grandmother   . Diabetes Maternal Grandfather   . Stroke Maternal Grandfather   . Hypertension Maternal Grandfather   . Hypertension Paternal Grandmother   . Diabetes Paternal Grandmother   . Cerebrovascular Accident Paternal Grandmother   . Hypertension Paternal Grandfather   . Cerebrovascular Accident Paternal Grandfather     Social History   Social History  . Marital status: Married   Spouse name: N/A  . Number of children: 2  . Years of education: BS degree   Occupational History  . Self employeed    Social History Main Topics  . Smoking status: Never Smoker  . Smokeless tobacco: Never Used  . Alcohol use No  . Drug use: No  . Sexual activity: Yes   Other Topics Concern  . 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 ALPRAZolam (XANAX) 0.25 MG tablet Take 1 tablet (0.25 mg total) by mouth 2 (two) times daily as needed for anxiety. (Patient not taking: Reported on 03/01/2017) 10 tablet 1   No facility-administered medications prior to visit.     No Known Allergies  Review of Systems  Constitutional: Negative for fever and malaise/fatigue.  HENT: Negative for congestion.   Eyes: Negative for blurred vision.  Respiratory: Negative for shortness of breath.   Cardiovascular: Negative for chest pain,  palpitations and leg swelling.  Gastrointestinal: Negative for abdominal pain, blood in stool and nausea.  Genitourinary: Negative for dysuria and frequency.  Musculoskeletal: Negative for falls.  Skin: Negative for rash.  Neurological: Negative for dizziness, loss of consciousness and headaches.  Endo/Heme/Allergies: Negative for environmental allergies.  Psychiatric/Behavioral: Negative for depression. The patient is nervous/anxious.        Objective:    Physical Exam  Constitutional: She is oriented to person, place, and time. She appears well-developed and well-nourished. No distress.  HENT:  Head: Normocephalic and atraumatic.  Nose: Nose normal.  Eyes: Right eye exhibits no discharge. Left eye exhibits no discharge.   Neck: Normal range of motion. Neck supple.  Cardiovascular: Normal rate and regular rhythm.   No murmur heard. Pulmonary/Chest: Effort normal and breath sounds normal.  Abdominal: Soft. Bowel sounds are normal. There is no tenderness.  Musculoskeletal: She exhibits no edema.  Neurological: She is alert and oriented to person, place, and time.  Skin: Skin is warm and dry.  Psychiatric: She has a normal mood and affect.  Nursing note and vitals reviewed.   BP 118/82 (BP Location: Right Arm, Patient Position: Sitting, Cuff Size: Normal)   Pulse 84   Temp 98.6 F (37 C) (Temporal)   Ht 5\' 3"  (1.6 m)   Wt 110 lb (49.9 kg)   LMP 01/31/2017   SpO2 97%   BMI 19.49 kg/m  Wt Readings from Last 3 Encounters:  03/01/17 110 lb (49.9 kg)  01/20/17 111 lb (50.3 kg)  11/04/16 118 lb (53.5 kg)     Lab Results  Component Value Date   WBC 8.3 01/20/2017   HGB 13.9 01/20/2017   HCT 41.8 01/20/2017   PLT 280.0 01/20/2017   GLUCOSE 89 01/20/2017   CHOL 210 (H) 11/04/2016   TRIG 146.0 11/04/2016   HDL 37.80 (L) 11/04/2016   LDLCALC 143 (H) 11/04/2016   ALT 19 01/20/2017   AST 17 01/20/2017   NA 138 01/20/2017   K 4.4 01/20/2017   CL 102 01/20/2017   CREATININE 0.86 01/20/2017   BUN 17 01/20/2017   CO2 30 01/20/2017   TSH 0.91 01/20/2017   HGBA1C 5.5 11/04/2016    Lab Results  Component Value Date   TSH 0.91 01/20/2017   Lab Results  Component Value Date   WBC 8.3 01/20/2017   HGB 13.9 01/20/2017   HCT 41.8 01/20/2017   MCV 87.1 01/20/2017   PLT 280.0 01/20/2017   Lab Results  Component Value Date   NA 138 01/20/2017   K 4.4 01/20/2017   CO2 30 01/20/2017   GLUCOSE 89 01/20/2017   BUN 17 01/20/2017   CREATININE 0.86 01/20/2017   BILITOT 0.3 01/20/2017   ALKPHOS 66 01/20/2017   AST 17 01/20/2017   ALT 19 01/20/2017   PROT 7.4 01/20/2017   ALBUMIN 4.0 01/20/2017   CALCIUM 9.4 01/20/2017   GFR 78.75 01/20/2017   Lab Results  Component Value Date   CHOL 210  (H) 11/04/2016   Lab Results  Component Value Date   HDL 37.80 (L) 11/04/2016   Lab Results  Component Value Date   LDLCALC 143 (H) 11/04/2016   Lab Results  Component Value Date   TRIG 146.0 11/04/2016   Lab Results  Component Value Date   CHOLHDL 6 11/04/2016   Lab Results  Component Value Date   HGBA1C 5.5 11/04/2016       Assessment & Plan:   Problem List Items Addressed This Visit  Anxiety state    Has not taken her Alprazolam at all but she feels she manages better since she stopped her OCP. She is considering counseling given [facility]. She will call if she decides to proceed      Hyperlipidemia, mild    Encouraged heart healthy diet, increase exercise, avoid trans fats, consider a krill oil cap daily      Hyperglycemia    hgba1c acceptable, minimize simple carbs. Increase exercise as tolerated. Continue current meds      Tachycardia    RRR today       Other Visit Diagnoses    Muscle spasm of back    -  Primary   Relevant Orders   Ambulatory referral to Orthopedic Surgery      I have discontinued Ms. Varin's ALPRAZolam.  No orders of the defined types were placed in this encounter.   Danise EdgeStacey Blyth, MD

## 2017-03-01 NOTE — Assessment & Plan Note (Addendum)
RRR today 

## 2017-03-01 NOTE — Patient Instructions (Signed)
Panic Attacks Panic attacks are sudden, short feelings of great fear or discomfort. You may have them for no reason when you are relaxed, when you are uneasy (anxious), or when you are sleeping. Follow these instructions at home:  Take all your medicines as told.  Check with your doctor before starting new medicines.  Keep all doctor visits. Contact a doctor if:  You are not able to take your medicines as told.  Your symptoms do not get better.  Your symptoms get worse. Get help right away if:  Your attacks seem different than your normal attacks.  You have thoughts about hurting yourself or others.  You take panic attack medicine and you have a side effect. This information is not intended to replace advice given to you by your health care provider. Make sure you discuss any questions you have with your health care provider. Document Released: 08/07/2010 Document Revised: 12/11/2015 Document Reviewed: 02/16/2013 Elsevier Interactive Patient Education  2017 Elsevier Inc.  

## 2017-03-06 NOTE — Assessment & Plan Note (Signed)
Encouraged heart healthy diet, increase exercise, avoid trans fats, consider a krill oil cap daily 

## 2017-03-06 NOTE — Assessment & Plan Note (Signed)
hgba1c acceptable, minimize simple carbs. Increase exercise as tolerated. Continue current meds 

## 2017-05-05 ENCOUNTER — Ambulatory Visit: Payer: BLUE CROSS/BLUE SHIELD | Admitting: Family Medicine

## 2017-05-05 DIAGNOSIS — Z0289 Encounter for other administrative examinations: Secondary | ICD-10-CM

## 2017-05-24 ENCOUNTER — Ambulatory Visit: Payer: BLUE CROSS/BLUE SHIELD | Admitting: Family Medicine

## 2017-11-09 ENCOUNTER — Ambulatory Visit: Payer: Self-pay | Admitting: *Deleted

## 2017-11-09 NOTE — Telephone Encounter (Signed)
Pt reports h/o "seasonal allergies." Symptoms worsening past 2-3 days. Reports dry cough, feels "mucous in back of throat" eyes itchy. Denies fever, facial pain. Throat "scratchy."  Pt was taking Claritin and/or  Allegra daily but "makes me too sleepy." States is staying hydrated. Reports "mild" sob with constant coughing at times. States"not bad." Denies wheezing. Requesting appt to discuss alternative medications for allergies.  Also reports H/O "low iron" due to frequent, prolonged menses; requests Fe level be checked as well. Does have appt with OB/GYN in June. Pt unable to make appt until next week. Appt secured with M. O'Sullivan for Monday 11/14/17. Care advise given per protocol.  Reason for Disposition . Cough has been present for > 3 weeks  Answer Assessment - Initial Assessment Questions 1. ONSET: "When did the cough begin?"      Last month, "beginning of season.' 2. SEVERITY: "How bad is the cough today?"      moderate 3. RESPIRATORY DISTRESS: "Describe your breathing."      SOB, mild, from "coughing so much." 4. FEVER: "Do you have a fever?" If so, ask: "What is your temperature, how was it measured, and when did it start?"     no 5. HEMOPTYSIS: "Are you coughing up any blood?" If so ask: "How much?" (flecks, streaks, tablespoons, etc.)     no 6. TREATMENT: "What have you done so far to treat the cough?" (e.g., meds, fluids, humidifier)     claritin , allegra 7. CARDIAC HISTORY: "Do you have any history of heart disease?" (e.g., heart attack, congestive heart failure)      no 8. LUNG HISTORY: "Do you have any history of lung disease?"  (e.g., pulmonary embolus, asthma, emphysema)     no 9. PE RISK FACTORS: "Do you have a history of blood clots?" (or: recent major surgery, recent prolonged travel, bedridden )    no 10. OTHER SYMPTOMS: "Do you have any other symptoms? (e.g., runny nose, wheezing, chest pain)       Eyes and throat itchy  Protocols used: COUGH - ACUTE  NON-PRODUCTIVE-A-AH

## 2017-11-14 ENCOUNTER — Ambulatory Visit (INDEPENDENT_AMBULATORY_CARE_PROVIDER_SITE_OTHER): Payer: Self-pay | Admitting: Family

## 2017-11-14 ENCOUNTER — Encounter: Payer: Self-pay | Admitting: Family

## 2017-11-14 VITALS — BP 127/80 | HR 88 | Temp 98.8°F | Resp 16 | Ht 63.0 in | Wt 120.0 lb

## 2017-11-14 DIAGNOSIS — B9789 Other viral agents as the cause of diseases classified elsewhere: Secondary | ICD-10-CM

## 2017-11-14 DIAGNOSIS — J069 Acute upper respiratory infection, unspecified: Secondary | ICD-10-CM

## 2017-11-14 DIAGNOSIS — J309 Allergic rhinitis, unspecified: Secondary | ICD-10-CM

## 2017-11-14 MED ORDER — BENZONATATE 100 MG PO CAPS: 100.0000 mg | ORAL_CAPSULE | Freq: Three times a day (TID) | ORAL | 0 refills | Status: DC | PRN

## 2017-11-14 MED ORDER — MONTELUKAST SODIUM 10 MG PO TABS: 10.0000 mg | ORAL_TABLET | Freq: Every day | ORAL | 3 refills | Status: DC

## 2017-11-14 NOTE — Progress Notes (Signed)
Subjective:    Patient ID: Tara Stevenson, female    DOB: [birth year], 37 y.o.   MRN: [id number]  HPI  Tara Stevenson is a 37 yr old female who presents today with c/o chills, cough, sore throat. Yesterday she had severe nasal congestion and had trouble sleeping. She is unable to use steroid nasal spray due to restlessness/anxiety .Son has bronchitis. Felt hot last night.  + fever.   She is using otc claritin for allergy symptoms. Has cough and nasal congestion.   Review of Systems    see HPI  Past Medical History:  Diagnosis Date  . Allergy   . Anemia   . Cervical radiculitis   . Heart murmur   . Hx of typhoid fever   . Hyperglycemia 04/15/2016  . Hyperlipidemia, mild 04/15/2016  . Left shoulder pain 04/15/2016  . Lightheadedness 11/04/2016  . Lumbar spine pain   . Tachycardia 11/04/2016  . Thoracic spine pain      Social History   Socioeconomic History  . Marital status: Married    Spouse name: Not on file  . Number of children: 2  . Years of education: BS degree  . Highest education level: Not on file  Occupational History  . Occupation: Self employeed  Social Needs  . Financial resource strain: Not on file  . Food insecurity:    Worry: Not on file    Inability: Not on file  . Transportation needs:    Medical: Not on file    Non-medical: Not on file  Tobacco Use  . Smoking status: Never Smoker  . Smokeless tobacco: Never Used  Substance and Sexual Activity  . Alcohol use: No    Alcohol/week: 0.0 oz  . Drug use: No  . Sexual activity: Yes  Lifestyle  . Physical activity:    Days per week: Not on file    Minutes per session: Not on file  . Stress: Not on file  Relationships  . Social connections:    Talks on phone: Not on file    Gets together: Not on file    Attends religious service: Not on file    Active member of club or organization: Not on file    Attends meetings of clubs or organizations: Not on file    Relationship status: Not on file  . Intimate  partner violence:    Fear of current or ex partner: Not on file    Emotionally abused: Not on file    Physically abused: Not on file    Forced sexual activity: Not on file  Other Topics Concern  . 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 CESAREAN SECTION     2005  . HERNIA REPAIR     2007    Family History  Problem Relation Age of Onset  . Hypertension Mother   . Osteoarthritis Mother   . Hypertension Father   . Hyperlipidemia Father   . Diabetes Father   . Hypertension Maternal Grandmother   . Arthritis Maternal Grandmother   .  Diabetes Maternal Grandfather   . Stroke Maternal Grandfather   . Hypertension Maternal Grandfather   . Hypertension Paternal Grandmother   . Diabetes Paternal Grandmother   . Cerebrovascular Accident Paternal Grandmother   . Hypertension Paternal Grandfather   . Cerebrovascular Accident Paternal Grandfather     Allergies  Allergen Reactions  . Flonase [Fluticasone Propionate]     Anxious, restless    No current outpatient medications on file prior to visit.   No current facility-administered medications on file prior to visit.     BP 127/80 (BP Location: Left Arm, Patient Position: Sitting, Cuff Size: Small)   Pulse 88   Temp 98.8 F (37.1 C) (Oral)   Resp 16   Ht  (1.6 m)   Wt 120 lb (54.4 kg)   LMP 10/27/2017   SpO2 100%   BMI 21.26 kg/m    Objective:   Physical Exam  Constitutional: She appears well-developed and well-nourished.  HENT:  Head: Normocephalic and atraumatic.  Right Ear: Tympanic membrane normal.  Left Ear: Tympanic membrane normal.  Mouth/Throat: No posterior oropharyngeal edema or  posterior oropharyngeal erythema.  Cardiovascular: Normal rate, regular rhythm and normal heart sounds.  No murmur heard. Pulmonary/Chest: Effort normal and breath sounds normal. No respiratory distress. She has no wheezes.  Psychiatric: She has a normal mood and affect. Her behavior is normal. Judgment and thought content normal.          Assessment & Plan:  Allergic rhinitis- uncontrolled. Intolerant to nasal steroids. Will add singulair. Continue claritin.  Viral URI with cough- superimposed on allergies. Advised mucinex prn for congestion, tessalon prn cough. Follow up if new/worsening symptoms or if symptoms fail to improve. Pt verbalizes understanding.

## 2017-11-14 NOTE — Patient Instructions (Signed)
Please begin singulair for allergies along with claritin. You may use tessalon 3 times daily as needed for cough. Your may use mucinex  twice daily.  Call if new/worsening symptom, fever >101 or if not improved in 3-4 days.

## 2018-03-21 ENCOUNTER — Emergency Department (HOSPITAL_BASED_OUTPATIENT_CLINIC_OR_DEPARTMENT_OTHER)
Admission: EM | Admit: 2018-03-21 | Discharge: 2018-03-21 | Disposition: A | Discharge status: Home / Self Care | Payer: No Typology Code available for payment source | Attending: Emergency Medicine | Admitting: Emergency Medicine

## 2018-03-21 ENCOUNTER — Other Ambulatory Visit: Payer: Self-pay

## 2018-03-21 ENCOUNTER — Emergency Department (HOSPITAL_BASED_OUTPATIENT_CLINIC_OR_DEPARTMENT_OTHER): Payer: No Typology Code available for payment source

## 2018-03-21 ENCOUNTER — Encounter (HOSPITAL_BASED_OUTPATIENT_CLINIC_OR_DEPARTMENT_OTHER): Payer: Self-pay

## 2018-03-21 DIAGNOSIS — Y999 Unspecified external cause status: Secondary | ICD-10-CM | POA: Diagnosis not present

## 2018-03-21 DIAGNOSIS — S61411A Laceration without foreign body of right hand, initial encounter: Secondary | ICD-10-CM | POA: Insufficient documentation

## 2018-03-21 DIAGNOSIS — Y939 Activity, unspecified: Secondary | ICD-10-CM | POA: Diagnosis not present

## 2018-03-21 DIAGNOSIS — W108XXA Fall (on) (from) other stairs and steps, initial encounter: Secondary | ICD-10-CM | POA: Insufficient documentation

## 2018-03-21 DIAGNOSIS — Y92009 Unspecified place in unspecified non-institutional (private) residence as the place of occurrence of the external cause: Secondary | ICD-10-CM | POA: Insufficient documentation

## 2018-03-21 DIAGNOSIS — Z23 Encounter for immunization: Secondary | ICD-10-CM | POA: Insufficient documentation

## 2018-03-21 DIAGNOSIS — W19XXXA Unspecified fall, initial encounter: Secondary | ICD-10-CM

## 2018-03-21 MED ORDER — LIDOCAINE-EPINEPHRINE 1 %-1:100000 IJ SOLN
INTRAMUSCULAR | Status: AC
  Administered 2018-03-21: 5 mL via INTRADERMAL
  Filled 2018-03-21: qty 1

## 2018-03-21 MED ORDER — TETANUS-DIPHTH-ACELL PERTUSSIS 5-2.5-18.5 LF-MCG/0.5 IM SUSP
0.5000 mL | Freq: Once | INTRAMUSCULAR | Status: AC
  Administered 2018-03-21: 0.5 mL via INTRAMUSCULAR
  Filled 2018-03-21: qty 0.5

## 2018-03-21 MED ORDER — LIDOCAINE-EPINEPHRINE 1 %-1:100000 IJ SOLN
5.0000 mL | Freq: Once | INTRAMUSCULAR | Status: AC
  Administered 2018-03-21: 5 mL via INTRADERMAL

## 2018-03-21 NOTE — ED Provider Notes (Signed)
[facility] EMERGENCY DEPARTMENT Provider Note   CSN: [id number] Arrival date & time: 03/21/18  4235     History   Chief Complaint Chief Complaint  Patient presents with  . Hand Injury    right    HPI Tara Stevenson is a 37 y.o. female.  HPI  37 year old female presents with concern for right hand injury after a fall on stairs.  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  Which is not sure what tetanus.  Bleeding controlled.  Reports some mild tingling of the finger distal to where it is but she is not sure if that is because she is improving pressure on the wound.  Reports she hit her head as she fell, but denies any significant headache at this time, has no nausea, vomiting, numbness, weakness, does not have any other injuries of concern.  Past Medical History:  Diagnosis Date  . Allergy   . Anemia   . Cervical radiculitis   . Heart murmur   . Hx of typhoid fever   . Hyperglycemia 04/15/2016  . Hyperlipidemia, mild 04/15/2016  . Left shoulder pain 04/15/2016  . Lightheadedness 11/04/2016  . Lumbar spine pain   . Tachycardia 11/04/2016  . Thoracic spine pain     Patient Active Problem List   Diagnosis Date Noted  . Tachycardia 11/04/2016  . Lightheadedness 11/04/2016  . Left shoulder pain 04/15/2016  . Hyperlipidemia, mild 04/15/2016  . Hyperglycemia 04/15/2016  . Allergy   . Hx of typhoid fever   . Anxiety state 06/26/2015  . Iron deficiency anemia, unspecified 03/26/2014    Past Surgical History:  Procedure Laterality Date  . CESAREAN SECTION     2005  . HERNIA REPAIR     2007     OB History   None      Home Medications    Prior to Admission medications   Medication Sig Start Date End Date Taking? Authorizing Provider  benzonatate (TESSALON) 100 MG capsule Take 1 capsule (100 mg total) by mouth 3 (three) times daily as needed. 11/14/17    Sandford Craze, NP  montelukast (SINGULAIR) 10 MG tablet Take 1 tablet (10 mg total) by mouth at bedtime. 11/14/17   Sandford Craze, NP    Family History Family History  Problem Relation Age of Onset  . Hypertension Mother   . Osteoarthritis Mother   . Hypertension Father   . Hyperlipidemia Father   . Diabetes Father   . Hypertension Maternal Grandmother   . Arthritis Maternal Grandmother   . Diabetes Maternal Grandfather   . Stroke Maternal Grandfather   . Hypertension Maternal Grandfather   . Hypertension Paternal Grandmother   . Diabetes Paternal Grandmother   . Cerebrovascular Accident Paternal Grandmother   . Hypertension Paternal Grandfather   . Cerebrovascular Accident Paternal Grandfather     Social History Social History   Tobacco Use  . Smoking status: Never Smoker  . Smokeless tobacco: Never Used  Substance Use Topics  . Alcohol use: No    Alcohol/week: 0.0 standard drinks  . Drug use: No     Allergies   Flonase [fluticasone propionate]   Review of Systems Review of Systems  Constitutional: Negative for fever.  HENT: Negative for sore throat.   Eyes: Negative for visual disturbance.  Gastrointestinal: Negative for abdominal pain, nausea and vomiting.  Genitourinary: Negative for difficulty urinating.  Musculoskeletal: Positive for  arthralgias. Negative for back pain and neck pain.  Skin: Positive for wound. Negative for rash.  Neurological: Negative for syncope, weakness, numbness and headaches.     Physical Exam Updated Vital Signs BP 138/84 (BP Location: Right Arm)   Pulse 72   Temp 98 F (36.7 C) (Oral)   Resp 16   Ht 5\' 3"  (1.6 m)   Wt 54.9 kg   LMP 03/05/2018   SpO2 100%   BMI 21.43 kg/m   Physical Exam  Constitutional: She is oriented to person, place, and time. She appears well-developed and well-nourished. No distress.  HENT:  Head: Normocephalic and atraumatic.  Eyes: Conjunctivae and EOM are normal.  Neck: Normal  range of motion.  Cardiovascular: Normal rate, regular rhythm, normal heart sounds and intact distal pulses. Exam reveals no gallop and no friction rub.  No murmur heard. Pulmonary/Chest: Effort normal and breath sounds normal. No respiratory distress. She has no wheezes. She has no rales.  Abdominal: Soft. She exhibits no distension. There is no tenderness. There is no guarding.  Musculoskeletal: She exhibits no edema or tenderness.       Right hand: She exhibits laceration and swelling. She exhibits normal capillary refill. Decreased sensation: mild ring finger. Normal strength noted.  Neurological: She is alert and oriented to person, place, and time.  Skin: Skin is warm and dry. No rash noted. She is not diaphoretic. No erythema.  Nursing note and vitals reviewed.    ED Treatments / Results  Labs (all labs ordered are listed, but only abnormal results are displayed) Labs Reviewed - No data to display  EKG None  Radiology Dg Hand Complete Right  Result Date: 03/21/2018 CLINICAL DATA:  Pain following fall EXAM: RIGHT HAND - COMPLETE 3+ VIEW COMPARISON:  None. FINDINGS: Frontal, oblique, and lateral views obtained. There is soft tissue swelling along the volar aspect of the hand. No radiopaque foreign body or soft tissue air. No evident fracture or dislocation. The joint spaces appear normal. No erosive change. IMPRESSION: Soft tissue swelling along the volar aspect of the hand. No fracture or dislocation. No appreciable arthropathy. No radiopaque foreign body. Electronically Signed   By: Bretta Bang III M.D.   On: 03/21/2018 09:09    Procedures .Marland KitchenLaceration Repair Date/Time: 03/21/2018 7:09 PM Performed by: Alvira Monday, MD Authorized by: Alvira Monday, MD   Consent:    Consent obtained:  Verbal   Consent given by:  Patient   Risks discussed:  Infection, pain, poor cosmetic result and tendon damage   Alternatives discussed:  No treatment Anesthesia (see MAR for exact  dosages):    Anesthesia method:  Local infiltration   Local anesthetic:  Lidocaine 1% WITH epi Laceration details:    Location:  Hand   Hand location:  R palm   Length (cm):  3 Repair type:    Repair type:  Intermediate Exploration:    Wound exploration: wound explored through full range of motion and entire depth of wound probed and visualized     Contaminated: no   Treatment:    Area cleansed with:  Saline   Irrigation solution:  Sterile saline   Irrigation volume:  300 Skin repair:    Repair method:  Sutures   Suture size:  4-0   Suture material:  Nylon   Suture technique:  Simple interrupted   Number of sutures:  4 Post-procedure details:    Dressing:  Antibiotic ointment   Patient tolerance of procedure:  Tolerated well, no immediate complications   (  including critical care time)  4 4.0 prolene sutures Medications Ordered in ED Medications  Tdap (BOOSTRIX) injection 0.5 mL (0.5 mLs Intramuscular Given 03/21/18 0940)  lidocaine-EPINEPHrine (XYLOCAINE W/EPI) 1 %-1:100000 (with pres) injection 5 mL (5 mLs Intradermal Given 03/21/18 0921)     Initial Impression / Assessment and Plan / ED Course  I have reviewed the triage vital signs and the nursing notes.  Pertinent labs & imaging results that were available during my care of the patient were reviewed by me and considered in my medical decision making (see chart for details).     Presents with concern for fall on the stairs with right hand laceration.  Patient denies any other injuries from the fall.  Reports that she did hit the back of her head as she was going downstairs, but denies any significant headache at this time, no nausea vomiting no neurologic deficits, normal mental status 2 hours after incident, and have low suspicion for intracranial bleed.  Patient does not report any other areas of injury or concern.  Tetanus is updated.  Patient with hand laceration without signs of tendon involvement or foreign body.   X-ray shows no sign of fracture or foreign body.  Full range of motion of hand, neurovascularly intact with the exception of mild sensation of numbness which may be pressure related.  Laceration repaired as above.  Patient discharged in stable condition with understanding of reasons to return.   Final Clinical Impressions(s) / ED Diagnoses   Final diagnoses:  Laceration of right hand without foreign body, initial encounter  Fall, initial encounter    ED Discharge Orders    None       Alvira Monday, MD 03/21/18 1916

## 2018-03-21 NOTE — ED Notes (Signed)
ED Provider at bedside. 

## 2018-03-21 NOTE — ED Triage Notes (Signed)
Pt was coming down her stairs and slipped. Pt injured her right hand.

## 2018-03-21 NOTE — ED Notes (Signed)
Lrg ice pack given to Pt for hand

## 2018-03-28 ENCOUNTER — Ambulatory Visit (INDEPENDENT_AMBULATORY_CARE_PROVIDER_SITE_OTHER): Payer: No Typology Code available for payment source | Admitting: Family Medicine

## 2018-03-28 ENCOUNTER — Encounter: Payer: Self-pay | Admitting: Family Medicine

## 2018-03-28 DIAGNOSIS — S6991XS Unspecified injury of right wrist, hand and finger(s), sequela: Secondary | ICD-10-CM

## 2018-03-28 DIAGNOSIS — S6991XD Unspecified injury of right wrist, hand and finger(s), subsequent encounter: Secondary | ICD-10-CM | POA: Diagnosis not present

## 2018-03-28 DIAGNOSIS — R000 Tachycardia, unspecified: Secondary | ICD-10-CM

## 2018-03-28 NOTE — Patient Instructions (Signed)
Incision Care, Adult An incision is a surgical cut that is made through your skin. Most incisions are closed after surgery. Your incision may be closed with stitches (sutures), staples, skin glue, or adhesive strips. You may need to return to your health care provider to have sutures or staples removed. This may occur several days to several weeks after your surgery. The incision needs to be cared for properly to prevent infection. How to care for your incision Incision care   Follow instructions from your health care provider about how to take care of your incision. Make sure you: ? Wash your hands with soap and water before you change the bandage (dressing). If soap and water are not available, use hand sanitizer. ? Change your dressing as told by your health care provider. ? Leave sutures, skin glue, or adhesive strips in place. These skin closures may need to stay in place for 2 weeks or longer. If adhesive strip edges start to loosen and curl up, you may trim the loose edges. Do not remove adhesive strips completely unless your health care provider tells you to do that.  Check your incision area every day for signs of infection. Check for: ? More redness, swelling, or pain. ? More fluid or blood. ? Warmth. ? Pus or a bad smell.  Ask your health care provider how to clean the incision. This may include: ? Using mild soap and water. ? Using a clean towel to pat the incision dry after cleaning it. ? Applying a cream or ointment. Do this only as told by your health care provider. ? Covering the incision with a clean dressing.  Ask your health care provider when you can leave the incision uncovered.  Do not take baths, swim, or use a hot tub until your health care provider approves. Ask your health care provider if you can take showers. You may only be allowed to take sponge baths for bathing. Medicines  If you were prescribed an antibiotic medicine, cream, or ointment, take or apply the  antibiotic as told by your health care provider. Do not stop taking or applying the antibiotic even if your condition improves.  Take over-the-counter and prescription medicines only as told by your health care provider. General instructions  Limit movement around your incision to improve healing. ? Avoid straining, lifting, or exercise for the first month, or for as long as told by your health care provider. ? Follow instructions from your health care provider about returning to your normal activities. ? Ask your health care provider what activities are safe.  Protect your incision from the sun when you are outside for the first 6 months, or for as long as told by your health care provider. Apply sunscreen around the scar or cover it up.  Keep all follow-up visits as told by your health care provider. This is important. Contact a health care provider if:  Your have more redness, swelling, or pain around the incision.  You have more fluid or blood coming from the incision.  Your incision feels warm to the touch.  You have pus or a bad smell coming from the incision.  You have a fever or shaking chills.  You are nauseous or you vomit.  You are dizzy.  Your sutures or staples come undone. Get help right away if:  You have a red streak coming from your incision.  Your incision bleeds through the dressing and the bleeding does not stop with gentle pressure.  The edges of   your incision open up and separate.  You have severe pain.  You have a rash.  You are confused.  You faint.  You have trouble breathing and a fast heartbeat. This information is not intended to replace advice given to you by your health care provider. Make sure you discuss any questions you have with your health care provider. Document Released: 01/22/2005 Document Revised: 03/12/2016 Document Reviewed: 01/21/2016 Elsevier Interactive Patient Education  2018 [employer]  

## 2018-03-29 DIAGNOSIS — S6991XS Unspecified injury of right wrist, hand and finger(s), sequela: Secondary | ICD-10-CM | POA: Insufficient documentation

## 2018-03-29 NOTE — Progress Notes (Signed)
Subjective:    Patient ID: Tara Stevenson, female    DOB: 1980-08-01, 37 y.o.   MRN: [id number]  No chief complaint on file.   HPI Patient is in today for evaluation of some stitches she had placed after injuring her hand. She fell at home and cut her hand on a railing on her stairs. Was seen at [facility] and stitches were placed and a Tetanus shot was placed. She feels well but was worried some of the incision was looking a little yellow. No discharge, warmth or redness. Well controlled, no changes to meds. Encouraged heart healthy diet such as the DASH diet and exercise as tolerated. No numbness, deceased rom in hand.   Past Medical History:  Diagnosis Date  . Allergy   . Anemia   . Cervical radiculitis   . Heart murmur   . Hx of typhoid fever   . Hyperglycemia 04/15/2016  . Hyperlipidemia, mild 04/15/2016  . Left shoulder pain 04/15/2016  . Lightheadedness 11/04/2016  . Lumbar spine pain   . Tachycardia 11/04/2016  . Thoracic spine pain     Past Surgical History:  Procedure Laterality Date  . CESAREAN SECTION     2005  . HERNIA REPAIR     2007    Family History  Problem Relation Age of Onset  . Hypertension Mother   . Osteoarthritis Mother   . Hypertension Father   . Hyperlipidemia Father   . Diabetes Father   . Hypertension Maternal Grandmother   . Arthritis Maternal Grandmother   . Diabetes Maternal Grandfather   . Stroke Maternal Grandfather   . Hypertension Maternal Grandfather   . Hypertension Paternal Grandmother   . Diabetes Paternal Grandmother   . Cerebrovascular Accident Paternal Grandmother   . Hypertension Paternal Grandfather   . Cerebrovascular Accident Paternal Grandfather     Social History   Socioeconomic History  . Marital status: Married    Spouse name: Not on file  . Number of children: 2  . Years of education: BS degree  . Highest education level: Not on file  Occupational History  . Occupation: Self employeed  Social Needs  . Financial  resource strain: Not on file  . Food insecurity:    Worry: Not on file    Inability: Not on file  . Transportation needs:    Medical: Not on file    Non-medical: Not on file  Tobacco Use  . Smoking status: Never Smoker  . Smokeless tobacco: Never Used  Substance and Sexual Activity  . Alcohol use: No    Alcohol/week: 0.0 standard drinks  . Drug use: No  . Sexual activity: Yes  Lifestyle  . Physical activity:    Days per week: Not on file    Minutes per session: Not on file  . Stress: Not on file  Relationships  . Social connections:    Talks on phone: Not on file    Gets together: Not on file    Attends religious service: Not on file    Active member of club or organization: Not on file    Attends meetings of clubs or organizations: Not on file    Relationship status: Not on file  . Intimate partner violence:    Fear of current or ex partner: Not on file    Emotionally abused: Not on file    Physically abused: Not on file    Forced sexual activity: Not on file  Other Topics Concern  . 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 benzonatate (TESSALON) 100 MG capsule Take 1 capsule (100 mg total) by mouth 3 (three) times daily as needed. 20 capsule 0  . montelukast (SINGULAIR) 10 MG tablet Take 1 tablet (10 mg total) by mouth at bedtime. 30 tablet 3   No facility-administered medications prior to visit.     Allergies  Allergen Reactions  . Flonase [Fluticasone Propionate]     Anxious, restless    Review of Systems  Constitutional: Negative for fever and malaise/fatigue.  HENT: Negative for congestion.   Eyes: Negative for blurred  vision.  Respiratory: Negative for shortness of breath.   Cardiovascular: Negative for chest pain, palpitations and leg swelling.  Gastrointestinal: Negative for abdominal pain, blood in stool and nausea.  Genitourinary: Negative for dysuria and frequency.  Musculoskeletal: Negative for falls.  Skin: Negative for rash.       Stitches noted in right palm at base of 3rd finge.   Neurological: Negative for dizziness, loss of consciousness and headaches.  Endo/Heme/Allergies: Negative for environmental allergies.  Psychiatric/Behavioral: Negative for depression. The patient is not nervous/anxious.        Objective:    Physical Exam  BP 98/68 (BP Location: Left Arm, Patient Position: Sitting, Cuff Size: Normal)   Pulse 84   Resp 18   Wt 121 lb (54.9 kg)   LMP 03/05/2018   SpO2 99%   BMI 21.43 kg/m  Wt Readings from Last 3 Encounters:  03/28/18 121 lb (54.9 kg)  03/21/18 121 lb (54.9 kg)  11/14/17 120 lb (54.4 kg)     Lab Results  Component Value Date   WBC 8.3 01/20/2017   HGB 13.9 01/20/2017   HCT 41.8 01/20/2017   PLT 280.0 01/20/2017   GLUCOSE 89 01/20/2017   CHOL 210 (H) 11/04/2016   TRIG 146.0 11/04/2016   HDL 37.80 (L) 11/04/2016   LDLCALC 143 (H) 11/04/2016   ALT 19 01/20/2017   AST 17 01/20/2017   NA 138 01/20/2017   K 4.4 01/20/2017   CL 102 01/20/2017   CREATININE 0.86 01/20/2017   BUN 17 01/20/2017   CO2 30 01/20/2017   TSH 0.91 01/20/2017   HGBA1C 5.5 11/04/2016    Lab Results  Component Value Date   TSH 0.91 01/20/2017   Lab Results  Component Value Date   WBC 8.3 01/20/2017   HGB 13.9 01/20/2017   HCT 41.8 01/20/2017   MCV 87.1 01/20/2017   PLT 280.0 01/20/2017   Lab Results  Component Value Date   NA 138 01/20/2017   K 4.4 01/20/2017   CO2 30 01/20/2017   GLUCOSE 89 01/20/2017   BUN 17 01/20/2017   CREATININE 0.86 01/20/2017   BILITOT 0.3 01/20/2017   ALKPHOS 66 01/20/2017   AST 17 01/20/2017   ALT 19 01/20/2017   PROT 7.4  01/20/2017   ALBUMIN 4.0 01/20/2017   CALCIUM 9.4 01/20/2017   GFR 78.75 01/20/2017   Lab Results  Component Value Date   CHOL 210 (H) 11/04/2016   Lab Results  Component Value Date   HDL 37.80 (L) 11/04/2016   Lab Results  Component Value Date   LDLCALC 143 (H) 11/04/2016   Lab Results  Component Value Date   TRIG 146.0 11/04/2016   Lab Results  Component Value Date   CHOLHDL 6 11/04/2016   Lab Results  Component Value Date   HGBA1C 5.5 11/04/2016       Assessment & Plan:   Problem List Items Addressed This Visit    Tachycardia    Improved on recheck      Hand injury, right, sequela    She fell down the stairs at her house and cut her hand on a rail she was seen and had it stitched up. Was given a Tetanus shot and she is here today because she was afraidd it was getting infected. No increased pain, discharge but she feels the cut between her fingers has turned slightly yellow. It is evaluated today and no sign of infection is noted she will etun early next week for sutue removal         I have discontinued Kyliee M. Pankowski's montelukast and benzonatate.  No orders of the defined types were placed in this encounter.     Danise Edge, MD

## 2018-03-29 NOTE — Assessment & Plan Note (Signed)
Improved on recheck.  

## 2018-03-29 NOTE — Assessment & Plan Note (Signed)
She fell down the stairs at her house and cut her hand on a rail she was seen and had it stitched up. Was given a Tetanus shot and she is here today because she was afraidd it was getting infected. No increased pain, discharge but she feels the cut between her fingers has turned slightly yellow. It is evaluated today and no sign of infection is noted she will etun early next week for sutue removal

## 2018-04-03 ENCOUNTER — Encounter: Payer: Self-pay | Admitting: Family Medicine

## 2018-04-03 ENCOUNTER — Ambulatory Visit (INDEPENDENT_AMBULATORY_CARE_PROVIDER_SITE_OTHER): Payer: No Typology Code available for payment source | Admitting: Family Medicine

## 2018-04-03 DIAGNOSIS — Z4802 Encounter for removal of sutures: Secondary | ICD-10-CM

## 2018-04-03 NOTE — Progress Notes (Signed)
Subjective:  I acted as a Neurosurgeon for Dr. Abner Greenspan. Tara Stevenson, [place]  Patient ID: Tara Stevenson, female    DOB: 1981-07-10, 37 y.o.   MRN: [id number]  No chief complaint on file.   HPI  Patient is in today for to have her stitches removed. She feels well, no fevers, chills, malaise, anorexia or other sign of systemic concerns. Still has trouble fully extending the fingers of right hand without some pressure/discomfort at sutures. Had sutures placed on 03/21/2018. No numbness or weakness into fingers.   Patient Care Team: Bradd Canary, MD as PCP - General (Family Medicine) Waynard Reeds, MD as Consulting Physician (Obstetrics and Gynecology)   Past Medical History:  Diagnosis Date  . Allergy   . Anemia   . Cervical radiculitis   . Heart murmur   . Hx of typhoid fever   . Hyperglycemia 04/15/2016  . Hyperlipidemia, mild 04/15/2016  . Left shoulder pain 04/15/2016  . Lightheadedness 11/04/2016  . Lumbar spine pain   . Tachycardia 11/04/2016  . Thoracic spine pain     Past Surgical History:  Procedure Laterality Date  . CESAREAN SECTION     2005  . HERNIA REPAIR     2007    Family History  Problem Relation Age of Onset  . Hypertension Mother   . Osteoarthritis Mother   . Hypertension Father   . Hyperlipidemia Father   . Diabetes Father   . Hypertension Maternal Grandmother   . Arthritis Maternal Grandmother   . Diabetes Maternal Grandfather   . Stroke Maternal Grandfather   . Hypertension Maternal Grandfather   . Hypertension Paternal Grandmother   . Diabetes Paternal Grandmother   . Cerebrovascular Accident Paternal Grandmother   . Hypertension Paternal Grandfather   . Cerebrovascular Accident Paternal Grandfather     Social History   Socioeconomic History  . Marital status: Married    Spouse name: Not on file  . Number of children: 2  . Years of education: BS degree  . Highest education level: Not on file  Occupational History  . Occupation: Self employeed    Social Needs  . Financial resource strain: Not on file  . Food insecurity:    Worry: Not on file    Inability: Not on file  . Transportation needs:    Medical: Not on file    Non-medical: Not on file  Tobacco Use  . Smoking status: Never Smoker  . Smokeless tobacco: Never Used  Substance and Sexual Activity  . Alcohol use: No    Alcohol/week: 0.0 standard drinks  . Drug use: No  . Sexual activity: Yes  Lifestyle  . Physical activity:    Days per week: Not on file    Minutes per session: Not on file  . Stress: Not on file  Relationships  . Social connections:    Talks on phone: Not on file    Gets together: Not on file    Attends religious service: Not on file    Active member of club or organization: Not on file    Attends meetings of clubs or organizations: Not on file    Relationship status: Not on file  . Intimate partner violence:    Fear of current or ex partner: Not on file    Emotionally abused: Not on file    Physically abused: Not on file    Forced sexual activity: Not on file  Other Topics Concern  . 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   No facility-administered medications prior to visit.     Allergies  Allergen Reactions  . Flonase [Fluticasone Propionate]     Anxious, restless    Review of Systems  Constitutional: Negative for fever and malaise/fatigue.  HENT: Negative for congestion.   Eyes: Negative for blurred vision.  Respiratory: Negative for shortness of breath.   Cardiovascular: Negative for chest pain, palpitations and leg swelling.  Gastrointestinal: Negative for abdominal pain, blood in stool and nausea.  Genitourinary: Negative  for dysuria and frequency.  Musculoskeletal: Negative for falls.  Skin: Negative for rash.  Neurological: Negative for dizziness, loss of consciousness and headaches.  Endo/Heme/Allergies: Negative for environmental allergies.  Psychiatric/Behavioral: Negative for depression. The patient is not nervous/anxious.        Objective:    Physical Exam  Constitutional: She is oriented to person, place, and time. She appears well-developed and well-nourished. No distress.  HENT:  Head: Normocephalic and atraumatic.  Nose: Nose normal.  Eyes: Right eye exhibits no discharge. Left eye exhibits no discharge.  Neck: Normal range of motion. Neck supple.  Cardiovascular: Normal rate and regular rhythm.  No murmur heard. Pulmonary/Chest: Effort normal and breath sounds normal.  Abdominal: Soft. Bowel sounds are normal. There is no tenderness.  Musculoskeletal: She exhibits no edema.  Right hand 5 sutures removed from palm at base of 4 th digit. No discharge or surrounding erythema.   Neurological: She is alert and oriented to person, place, and time.  Skin: Skin is warm and dry.  Psychiatric: She has a normal mood and affect.  Nursing note and vitals reviewed.   BP 110/70 (BP Location: Left Arm, Patient Position: Sitting, Cuff Size: Normal)   Pulse 70   Temp 98.4 F (36.9 C) (Oral)   Resp 18   Ht 5\' 3"  (1.6 m)   Wt 121 lb 12.8 oz (55.2 kg)   LMP 03/05/2018   SpO2 98%   BMI 21.58 kg/m  Wt Readings from Last 3 Encounters:  04/03/18 121 lb 12.8 oz (55.2 kg)  03/28/18 121 lb (54.9 kg)  03/21/18 121 lb (54.9 kg)   BP Readings from Last 3 Encounters:  04/03/18 110/70  03/28/18 98/68  03/21/18 138/84     Immunization History  Administered Date(s) Administered  . Tdap 03/21/2018    Health Maintenance  Topic Date Due  . INFLUENZA VACCINE  02/16/2018  . PAP SMEAR  08/19/2018  . TETANUS/TDAP  03/21/2028  . HIV Screening  Completed    Lab Results  Component Value Date   WBC 8.3  01/20/2017   HGB 13.9 01/20/2017   HCT 41.8 01/20/2017   PLT 280.0 01/20/2017   GLUCOSE 89 01/20/2017   CHOL 210 (H) 11/04/2016   TRIG 146.0 11/04/2016   HDL 37.80 (L) 11/04/2016   LDLCALC 143 (H) 11/04/2016   ALT 19 01/20/2017   AST 17 01/20/2017   NA 138 01/20/2017   K 4.4 01/20/2017   CL 102 01/20/2017   CREATININE 0.86 01/20/2017   BUN 17 01/20/2017   CO2 30 01/20/2017   TSH 0.91  01/20/2017   HGBA1C 5.5 11/04/2016    Lab Results  Component Value Date   TSH 0.91 01/20/2017   Lab Results  Component Value Date   WBC 8.3 01/20/2017   HGB 13.9 01/20/2017   HCT 41.8 01/20/2017   MCV 87.1 01/20/2017   PLT 280.0 01/20/2017   Lab Results  Component Value Date   NA 138 01/20/2017   K 4.4 01/20/2017   CO2 30 01/20/2017   GLUCOSE 89 01/20/2017   BUN 17 01/20/2017   CREATININE 0.86 01/20/2017   BILITOT 0.3 01/20/2017   ALKPHOS 66 01/20/2017   AST 17 01/20/2017   ALT 19 01/20/2017   PROT 7.4 01/20/2017   ALBUMIN 4.0 01/20/2017   CALCIUM 9.4 01/20/2017   GFR 78.75 01/20/2017   Lab Results  Component Value Date   CHOL 210 (H) 11/04/2016   Lab Results  Component Value Date   HDL 37.80 (L) 11/04/2016   Lab Results  Component Value Date   LDLCALC 143 (H) 11/04/2016   Lab Results  Component Value Date   TRIG 146.0 11/04/2016   Lab Results  Component Value Date   CHOLHDL 6 11/04/2016   Lab Results  Component Value Date   HGBA1C 5.5 11/04/2016         Assessment & Plan:   Problem List Items Addressed This Visit    Visit for suture removal    Area cleaned with Iodine and alcohol. 5 sutures removed without complication. Patient tolerated well. Cleaned with H2O2 then covered with antibiotic ointment and bandaid. Keep clean and dry, no heavy lifting for next 2 weeks as it heals and notify us if any concerns         Tara Stevenson does not currently have medications on file.  No orders of the defined types were placed in this encounter.   CMA  served as Neurosurgeon during this visit. History, Physical and Plan performed by medical provider. Documentation and orders reviewed and attested to.  Danise Edge, MD

## 2018-04-03 NOTE — Patient Instructions (Signed)
Suture Removal, Care After Refer to this sheet in the next few weeks. These instructions provide you with information on caring for yourself after your procedure. Your health care provider may also give you more specific instructions. Your treatment has been planned according to current medical practices, but problems sometimes occur. Call your health care provider if you have any problems or questions after your procedure. What can I expect after the procedure? After your stitches (sutures) are removed, it is typical to have the following:  Some discomfort and swelling in the wound area.  Slight redness in the area.  Follow these instructions at home:  If you have skin adhesive strips over the wound area, do not take the strips off. They will fall off on their own in a few days. If the strips remain in place after 14 days, you may remove them.  Change any bandages (dressings) at least once a day or as directed by your health care provider. If the bandage sticks, soak it off with warm, soapy water.  Apply cream or ointment only as directed by your health care provider. If using cream or ointment, wash the area with soap and water 2 times a day to remove all the cream or ointment. Rinse off the soap and pat the area dry with a clean towel.  Keep the wound area dry and clean. If the bandage becomes wet or dirty, or if it develops a bad smell, change it as soon as possible.  Continue to protect the wound from injury.  Use sunscreen when out in the sun. New scars become sunburned easily. Contact a health care provider if:  You have increasing redness, swelling, or pain in the wound.  You see pus coming from the wound.  You have a fever.  You notice a bad smell coming from the wound or dressing.  Your wound breaks open (edges not staying together). This information is not intended to replace advice given to you by your health care provider. Make sure you discuss any questions you have  with your health care provider. Document Released: 03/30/2001 Document Revised: 12/11/2015 Document Reviewed: 02/14/2013 Elsevier Interactive Patient Education  2017 [employer] Inc[employer]  

## 2018-04-03 NOTE — Assessment & Plan Note (Signed)
Area cleaned with Iodine and alcohol. 5 sutures removed without complication. Patient tolerated well. Cleaned with H2O2 then covered with antibiotic ointment and bandaid. Keep clean and dry, no heavy lifting for next 2 weeks as it heals and notify us if any concerns

## 2019-01-25 ENCOUNTER — Ambulatory Visit (INDEPENDENT_AMBULATORY_CARE_PROVIDER_SITE_OTHER): Payer: Self-pay | Admitting: Family Medicine

## 2019-01-25 ENCOUNTER — Other Ambulatory Visit: Payer: Self-pay

## 2019-01-25 DIAGNOSIS — M533 Sacrococcygeal disorders, not elsewhere classified: Secondary | ICD-10-CM

## 2019-01-25 DIAGNOSIS — T7840XA Allergy, unspecified, initial encounter: Secondary | ICD-10-CM

## 2019-01-25 DIAGNOSIS — D509 Iron deficiency anemia, unspecified: Secondary | ICD-10-CM

## 2019-01-25 MED ORDER — ALBUTEROL SULFATE HFA 108 (90 BASE) MCG/ACT IN AERS: 2.0000 | INHALATION_SPRAY | Freq: Four times a day (QID) | RESPIRATORY_TRACT | 2 refills | Status: DC | PRN

## 2019-01-25 MED ORDER — MONTELUKAST SODIUM 10 MG PO TABS: 10.0000 mg | ORAL_TABLET | Freq: Every day | ORAL | 3 refills | Status: DC

## 2019-01-28 DIAGNOSIS — M549 Dorsalgia, unspecified: Secondary | ICD-10-CM | POA: Insufficient documentation

## 2019-01-28 DIAGNOSIS — M533 Sacrococcygeal disorders, not elsewhere classified: Secondary | ICD-10-CM | POA: Insufficient documentation

## 2019-01-28 NOTE — Assessment & Plan Note (Signed)
Increase leafy greens, consider increased lean red meat and using cast iron cookware. Continue to monitor, report any concerns 

## 2019-01-28 NOTE — Progress Notes (Signed)
Virtual Visit via Video Note  I connected with Tara Stevenson on 01/25/19 at 10:00 AM EDT by a video enabled telemedicine application and verified that I am speaking with the correct person using two identifiers.  Location: Patient: home Provider: office   I discussed the limitations of evaluation and management by telemedicine and the availability of in person appointments. The patient expressed understanding and agreed to proceed.    Subjective:    Patient ID: Tara Stevenson, female    DOB: September 19, 1980, 38 y.o.   MRN: [id number]  No chief complaint on file.   HPI Patient is in today for evaluation of numerous concerns including allergies, back pain and more. She notes some trouble with head and chest congestion with allergies. No recent febrile illness or hospitalizations. No polyuria or polydipsia. Is maintaining quarantine. Notes low back pain with pain down legs but no incontinence. It is persistent since she fell on the stairs over a month ago. Denies CP/palp/SOB/HA/congestion/fevers/GI or GU c/o. Taking meds as prescribed  Past Medical History:  Diagnosis Date  . Allergy   . Anemia   . Cervical radiculitis   . Heart murmur   . Hx of typhoid fever   . Hyperglycemia 04/15/2016  . Hyperlipidemia, mild 04/15/2016  . Left shoulder pain 04/15/2016  . Lightheadedness 11/04/2016  . Lumbar spine pain   . Tachycardia 11/04/2016  . Thoracic spine pain     Past Surgical History:  Procedure Laterality Date  . CESAREAN SECTION     2005  . HERNIA REPAIR     2007    Family History  Problem Relation Age of Onset  . Hypertension Mother   . Osteoarthritis Mother   . Hypertension Father   . Hyperlipidemia Father   . Diabetes Father   . Hypertension Maternal Grandmother   . Arthritis Maternal Grandmother   . Diabetes Maternal Grandfather   . Stroke Maternal Grandfather   . Hypertension Maternal Grandfather   . Hypertension Paternal Grandmother   . Diabetes Paternal Grandmother   .  Cerebrovascular Accident Paternal Grandmother   . Hypertension Paternal Grandfather   . Cerebrovascular Accident Paternal Grandfather     Social History   Socioeconomic History  . Marital status: Married    Spouse name: Not on file  . Number of children: 2  . Years of education: BS degree  . Highest education level: Not on file  Occupational History  . Occupation: Self employeed  Social Needs  . Financial resource strain: Not on file  . Food insecurity    Worry: Not on file    Inability: Not on file  . Transportation needs    Medical: Not on file    Non-medical: Not on file  Tobacco Use  . Smoking status: Never Smoker  . Smokeless tobacco: Never Used  Substance and Sexual Activity  . Alcohol use: No    Alcohol/week: 0.0 standard drinks  . Drug use: No  . Sexual activity: Yes  Lifestyle  . Physical activity    Days per week: Not on file    Minutes per session: Not on file  . Stress: Not on file  Relationships  . Social Musicianconnections    Talks on phone: Not on file    Gets together: Not on file    Attends religious service: Not on file    Active member of club or organization: Not on file    Attends meetings of clubs or organizations: Not on file    Relationship status:  Not on file  . Intimate partner violence    Fear of current or ex partner: Not on file    Emotionally abused: Not on file    Physically abused: Not on file    Forced sexual activity: Not on file  Other Topics Concern  . 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   No facility-administered medications prior to visit.     Allergies  Allergen Reactions  . Flonase [Fluticasone Propionate]      Anxious, restless    Review of Systems  Constitutional: Positive for malaise/fatigue. Negative for fever.  HENT: Positive for congestion.   Eyes: Negative for blurred vision.  Respiratory: Negative for shortness of breath.   Cardiovascular: Negative for chest pain, palpitations and leg swelling.  Gastrointestinal: Negative for abdominal pain, blood in stool and nausea.  Genitourinary: Negative for dysuria and frequency.  Musculoskeletal: Positive for back pain and joint pain. Negative for falls.  Skin: Negative for rash.  Neurological: Negative for dizziness, loss of consciousness and headaches.  Endo/Heme/Allergies: Negative for environmental allergies.  Psychiatric/Behavioral: Negative for depression. The patient is not nervous/anxious.        Objective:    Physical Exam Constitutional:      Appearance: Normal appearance. She is not ill-appearing.  HENT:     Head: Normocephalic and atraumatic.     Nose: Nose normal.  Pulmonary:     Effort: Pulmonary effort is normal.  Neurological:     Mental Status: She is alert and oriented to person, place, and time.  Psychiatric:        Mood and Affect: Mood normal.        Behavior: Behavior normal.     There were no vitals taken for this visit. Wt Readings from Last 3 Encounters:  04/03/18 121 lb 12.8 oz (55.2 kg)  03/28/18 121 lb (54.9 kg)  03/21/18 121 lb (54.9 kg)    Diabetic Foot Exam - Simple   No data filed     Lab Results  Component Value Date   WBC 8.3 01/20/2017   HGB 13.9 01/20/2017   HCT 41.8 01/20/2017   PLT 280.0 01/20/2017   GLUCOSE 89 01/20/2017   CHOL 210 (H) 11/04/2016   TRIG 146.0 11/04/2016   HDL 37.80 (L) 11/04/2016   LDLCALC 143 (H) 11/04/2016   ALT 19 01/20/2017   AST 17 01/20/2017   NA 138 01/20/2017   K 4.4 01/20/2017   CL 102 01/20/2017   CREATININE 0.86 01/20/2017   BUN 17 01/20/2017   CO2 30 01/20/2017   TSH 0.91 01/20/2017   HGBA1C 5.5 11/04/2016    Lab Results  Component Value  Date   TSH 0.91 01/20/2017   Lab Results  Component Value Date   WBC 8.3 01/20/2017   HGB 13.9 01/20/2017   HCT 41.8 01/20/2017   MCV 87.1 01/20/2017   PLT 280.0 01/20/2017   Lab Results  Component Value Date   NA 138 01/20/2017   K 4.4 01/20/2017   CO2 30 01/20/2017   GLUCOSE 89 01/20/2017  BUN 17 01/20/2017   CREATININE 0.86 01/20/2017   BILITOT 0.3 01/20/2017   ALKPHOS 66 01/20/2017   AST 17 01/20/2017   ALT 19 01/20/2017   PROT 7.4 01/20/2017   ALBUMIN 4.0 01/20/2017   CALCIUM 9.4 01/20/2017   GFR 78.75 01/20/2017   Lab Results  Component Value Date   CHOL 210 (H) 11/04/2016   Lab Results  Component Value Date   HDL 37.80 (L) 11/04/2016   Lab Results  Component Value Date   LDLCALC 143 (H) 11/04/2016   Lab Results  Component Value Date   TRIG 146.0 11/04/2016   Lab Results  Component Value Date   CHOLHDL 6 11/04/2016   Lab Results  Component Value Date   HGBA1C 5.5 11/04/2016       Assessment & Plan:   Problem List Items Addressed This Visit    Iron deficiency anemia, unspecified    Increase leafy greens, consider increased lean red meat and using cast iron cookware. Continue to monitor, report any concerns      Allergy    Continue current meds and refills on medications given, is allowed Albuterol to have on hand if her breathing is compromised      Sacral pain - Primary    Has been struggling with pain with some radicular symptoms down legs since a fall down the stairs. Will proceed with xray and she will report if worsens.       Relevant Orders   DG HIPS BILAT W OR W/O PELVIS 2V      I am having Tara Stevenson start on montelukast and albuterol.  Meds ordered this encounter  Medications  . montelukast (SINGULAIR) 10 MG tablet    Sig: Take 1 tablet (10 mg total) by mouth at bedtime.    Dispense:  30 tablet    Refill:  3  . albuterol (VENTOLIN HFA) 108 (90 Base) MCG/ACT inhaler    Sig: Inhale 2 puffs into the lungs every 6  (six) hours as needed for wheezing or shortness of breath.    Dispense:  18 g    Refill:  2    I discussed the assessment and treatment plan with the patient. The patient was provided an opportunity to ask questions and all were answered. The patient agreed with the plan and demonstrated an understanding of the instructions.   The patient was advised to call back or seek an in-person evaluation if the symptoms worsen or if the condition fails to improve as anticipated.  I provided 25 minutes of non-face-to-face time during this encounter.   Penni Homans, MD

## 2019-01-28 NOTE — Assessment & Plan Note (Addendum)
Continue current meds and refills on medications given, is allowed Albuterol to have on hand if her breathing is compromised

## 2019-01-28 NOTE — Assessment & Plan Note (Signed)
Has been struggling with pain with some radicular symptoms down legs since a fall down the stairs. Will proceed with xray and she will report if worsens.

## 2019-01-30 ENCOUNTER — Telehealth: Payer: Self-pay | Admitting: Family Medicine

## 2019-01-30 DIAGNOSIS — R739 Hyperglycemia, unspecified: Secondary | ICD-10-CM

## 2019-01-30 DIAGNOSIS — R000 Tachycardia, unspecified: Secondary | ICD-10-CM

## 2019-01-30 DIAGNOSIS — E785 Hyperlipidemia, unspecified: Secondary | ICD-10-CM

## 2019-01-30 NOTE — Telephone Encounter (Signed)
Orders placed.

## 2019-01-30 NOTE — Telephone Encounter (Signed)
Pt states that she is supposed to come in for lab work including vit D, Vit B12, calcium, TSH, and fasting labs. No orders are in. Pt is scheduled for 02/08/2019 for labs. Please enter orders.

## 2019-02-08 ENCOUNTER — Telehealth: Payer: Self-pay | Admitting: Family Medicine

## 2019-02-08 ENCOUNTER — Other Ambulatory Visit (INDEPENDENT_AMBULATORY_CARE_PROVIDER_SITE_OTHER): Payer: No Typology Code available for payment source

## 2019-02-08 ENCOUNTER — Other Ambulatory Visit: Payer: Self-pay

## 2019-02-08 DIAGNOSIS — R739 Hyperglycemia, unspecified: Secondary | ICD-10-CM

## 2019-02-08 DIAGNOSIS — E785 Hyperlipidemia, unspecified: Secondary | ICD-10-CM | POA: Diagnosis not present

## 2019-02-08 DIAGNOSIS — R000 Tachycardia, unspecified: Secondary | ICD-10-CM | POA: Diagnosis not present

## 2019-02-08 DIAGNOSIS — K625 Hemorrhage of anus and rectum: Secondary | ICD-10-CM

## 2019-02-08 DIAGNOSIS — E539 Vitamin B deficiency, unspecified: Secondary | ICD-10-CM

## 2019-02-08 DIAGNOSIS — E559 Vitamin D deficiency, unspecified: Secondary | ICD-10-CM

## 2019-02-08 LAB — LIPID PANEL
Cholesterol: 193 mg/dL (ref 0–200)
HDL: 46.3 mg/dL (ref >39.00)
LDL Cholesterol: 127 mg/dL — ABNORMAL HIGH (ref 0–99)
NonHDL: 146.46
Total CHOL/HDL Ratio: 4
Triglycerides: 95 mg/dL (ref 0.0–149.0)
VLDL: 19 mg/dL (ref 0.0–40.0)

## 2019-02-08 LAB — COMPREHENSIVE METABOLIC PANEL
ALT: 14 U/L (ref 0–35)
AST: 15 U/L (ref 0–37)
Albumin: 4.2 g/dL (ref 3.5–5.2)
Alkaline Phosphatase: 76 U/L (ref 39–117)
BUN: 10 mg/dL (ref 6–23)
CO2: 24 mEq/L (ref 19–32)
Calcium: 9 mg/dL (ref 8.4–10.5)
Chloride: 107 mEq/L (ref 96–112)
Creatinine, Ser: 0.73 mg/dL (ref 0.40–1.20)
GFR: 88.56 mL/min (ref >60.00)
Glucose, Bld: 91 mg/dL (ref 70–99)
Potassium: 4.2 mEq/L (ref 3.5–5.1)
Sodium: 137 mEq/L (ref 135–145)
Total Bilirubin: 0.2 mg/dL (ref 0.2–1.2)
Total Protein: 7.2 g/dL (ref 6.0–8.3)

## 2019-02-08 LAB — CBC
HCT: 29.3 % — ABNORMAL LOW (ref 36.0–46.0)
Hemoglobin: 8.9 g/dL — ABNORMAL LOW (ref 12.0–15.0)
MCHC: 30.2 g/dL (ref 30.0–36.0)
MCV: 65.8 fl — ABNORMAL LOW (ref 78.0–100.0)
Platelets: 282 10*3/uL (ref 150.0–400.0)
RBC: 4.45 Mil/uL (ref 3.87–5.11)
RDW: 17.5 % — ABNORMAL HIGH (ref 11.5–15.5)
WBC: 4.6 10*3/uL (ref 4.0–10.5)

## 2019-02-08 LAB — TSH: TSH: 1.74 u[IU]/mL (ref 0.35–4.50)

## 2019-02-08 NOTE — Telephone Encounter (Signed)
Pt came in office to get labs done, pt stated has concern about her last prescription that was given to her for allergies, pt states it is a type of singular and that it has lots of 2nd effects that she read on the rx, pt states that one of the effects is that if pt has any anxiety (pt states does have anxiety) that it is not recommendable, pt has not taken the medication yet but wants to know if the side effect will affect her since she has anxiety. Please advise ASAP pt tel [phone].

## 2019-02-08 NOTE — Telephone Encounter (Signed)
Please advise 

## 2019-02-08 NOTE — Telephone Encounter (Signed)
I have never see this medicine cause anxiety. It is on the list but not common it will either be seen or not so if she has not seen it she should be ok. Also if this were to happen this the kind of side effect that stops as soon as you stop the med. If it is helping she should keep taking it. I wil check her labs and see what labs she needs and send another note. Needs a cmp and cbc at least

## 2019-02-13 MED ORDER — FERROUS FUMARATE 324 (106 FE) MG PO TABS: 1.0000 | ORAL_TABLET | Freq: Every day | ORAL | 3 refills | Status: DC

## 2019-02-13 NOTE — Addendum Note (Signed)
Addended by: Magdalene Molly A on: 02/13/2019 11:55 AM   Modules accepted: Orders

## 2019-02-13 NOTE — Telephone Encounter (Signed)
Patient notified

## 2019-02-13 NOTE — Addendum Note (Signed)
Addended by: Magdalene Molly A on: 02/13/2019 11:34 AM   Modules accepted: Orders

## 2019-02-27 ENCOUNTER — Other Ambulatory Visit: Payer: No Typology Code available for payment source

## 2019-03-01 ENCOUNTER — Encounter: Payer: Self-pay | Admitting: Family Medicine

## 2019-03-01 LAB — RESULTS CONSOLE HPV: CHL HPV: NEGATIVE

## 2019-03-13 ENCOUNTER — Other Ambulatory Visit: Payer: No Typology Code available for payment source

## 2019-04-03 ENCOUNTER — Other Ambulatory Visit (INDEPENDENT_AMBULATORY_CARE_PROVIDER_SITE_OTHER): Payer: No Typology Code available for payment source

## 2019-04-03 ENCOUNTER — Other Ambulatory Visit: Payer: Self-pay

## 2019-04-03 DIAGNOSIS — R000 Tachycardia, unspecified: Secondary | ICD-10-CM | POA: Diagnosis not present

## 2019-04-03 DIAGNOSIS — K625 Hemorrhage of anus and rectum: Secondary | ICD-10-CM

## 2019-04-03 DIAGNOSIS — R739 Hyperglycemia, unspecified: Secondary | ICD-10-CM

## 2019-04-03 DIAGNOSIS — E559 Vitamin D deficiency, unspecified: Secondary | ICD-10-CM

## 2019-04-03 DIAGNOSIS — E539 Vitamin B deficiency, unspecified: Secondary | ICD-10-CM | POA: Diagnosis not present

## 2019-04-04 LAB — CBC
HCT: 32.5 % — ABNORMAL LOW (ref 36.0–46.0)
Hemoglobin: 9.9 g/dL — ABNORMAL LOW (ref 12.0–15.0)
MCHC: 30.4 g/dL (ref 30.0–36.0)
MCV: 71.6 fl — ABNORMAL LOW (ref 78.0–100.0)
Platelets: 241 10*3/uL (ref 150.0–400.0)
RBC: 4.55 Mil/uL (ref 3.87–5.11)
RDW: 23.5 % — ABNORMAL HIGH (ref 11.5–15.5)
WBC: 6.1 10*3/uL (ref 4.0–10.5)

## 2019-04-04 LAB — FERRITIN: Ferritin: 5.1 ng/mL — ABNORMAL LOW (ref 10.0–291.0)

## 2019-04-04 LAB — VITAMIN B12: Vitamin B-12: 397 pg/mL (ref 211–911)

## 2019-04-04 LAB — VITAMIN D 25 HYDROXY (VIT D DEFICIENCY, FRACTURES): VITD: 17.44 ng/mL — ABNORMAL LOW (ref 30.00–100.00)

## 2019-04-16 ENCOUNTER — Other Ambulatory Visit: Payer: Self-pay

## 2019-04-17 ENCOUNTER — Encounter: Payer: Self-pay | Admitting: Family Medicine

## 2019-04-17 ENCOUNTER — Ambulatory Visit (INDEPENDENT_AMBULATORY_CARE_PROVIDER_SITE_OTHER): Payer: No Typology Code available for payment source | Admitting: Family Medicine

## 2019-04-17 VITALS — BP 108/68 | HR 75 | Temp 97.7°F | Resp 18 | Wt 123.8 lb

## 2019-04-17 DIAGNOSIS — R739 Hyperglycemia, unspecified: Secondary | ICD-10-CM | POA: Diagnosis not present

## 2019-04-17 DIAGNOSIS — T7840XA Allergy, unspecified, initial encounter: Secondary | ICD-10-CM | POA: Diagnosis not present

## 2019-04-17 DIAGNOSIS — M533 Sacrococcygeal disorders, not elsewhere classified: Secondary | ICD-10-CM

## 2019-04-17 DIAGNOSIS — Z0000 Encounter for general adult medical examination without abnormal findings: Secondary | ICD-10-CM

## 2019-04-17 DIAGNOSIS — D508 Other iron deficiency anemias: Secondary | ICD-10-CM | POA: Diagnosis not present

## 2019-04-17 DIAGNOSIS — D649 Anemia, unspecified: Secondary | ICD-10-CM | POA: Insufficient documentation

## 2019-04-17 DIAGNOSIS — E785 Hyperlipidemia, unspecified: Secondary | ICD-10-CM

## 2019-04-17 DIAGNOSIS — E559 Vitamin D deficiency, unspecified: Secondary | ICD-10-CM | POA: Insufficient documentation

## 2019-04-17 MED ORDER — VITAMIN D (ERGOCALCIFEROL) 1.25 MG (50000 UNIT) PO CAPS: 50000.0000 [IU] | ORAL_CAPSULE | ORAL | 4 refills | Status: DC

## 2019-04-17 NOTE — Patient Instructions (Addendum)
Nasal saline  Similan eye drops    Preventive Care 61-39 Years Old, Female Preventive care refers to visits with your health care provider and lifestyle choices that can promote health and wellness. This includes:  A yearly physical exam. This may also be called an annual well check.  Regular dental visits and eye exams.  Immunizations.  Screening for certain conditions.  Healthy lifestyle choices, such as eating a healthy diet, getting regular exercise, not using drugs or products that contain nicotine and tobacco, and limiting alcohol use. What can I expect for my preventive care visit? Physical exam Your health care provider will check your:  Height and weight. This may be used to calculate body mass index (BMI), which tells if you are at a healthy weight.  Heart rate and blood pressure.  Skin for abnormal spots. Counseling Your health care provider may ask you questions about your:  Alcohol, tobacco, and drug use.  Emotional well-being.  Home and relationship well-being.  Sexual activity.  Eating habits.  Work and work Statistician.  Method of birth control.  Menstrual cycle.  Pregnancy history. What immunizations do I need?  Influenza (flu) vaccine  This is recommended every year. Tetanus, diphtheria, and pertussis (Tdap) vaccine  You may need a Td booster every 10 years. Varicella (chickenpox) vaccine  You may need this if you have not been vaccinated. Human papillomavirus (HPV) vaccine  If recommended by your health care provider, you may need three doses over 6 months. Measles, mumps, and rubella (MMR) vaccine  You may need at least one dose of MMR. You may also need a second dose. Meningococcal conjugate (MenACWY) vaccine  One dose is recommended if you are age 85-21 years and a first-year college student living in a residence hall, or if you have one of several medical conditions. You may also need additional booster doses. Pneumococcal  conjugate (PCV13) vaccine  You may need this if you have certain conditions and were not previously vaccinated. Pneumococcal polysaccharide (PPSV23) vaccine  You may need one or two doses if you smoke cigarettes or if you have certain conditions. Hepatitis A vaccine  You may need this if you have certain conditions or if you travel or work in places where you may be exposed to hepatitis A. Hepatitis B vaccine  You may need this if you have certain conditions or if you travel or work in places where you may be exposed to hepatitis B. Haemophilus influenzae type b (Hib) vaccine  You may need this if you have certain conditions. You may receive vaccines as individual doses or as more than one vaccine together in one shot (combination vaccines). Talk with your health care provider about the risks and benefits of combination vaccines. What tests do I need?  Blood tests  Lipid and cholesterol levels. These may be checked every 5 years starting at age 50.  Hepatitis C test.  Hepatitis B test. Screening  Diabetes screening. This is done by checking your blood sugar (glucose) after you have not eaten for a while (fasting).  Sexually transmitted disease (STD) testing.  BRCA-related cancer screening. This may be done if you have a family history of breast, ovarian, tubal, or peritoneal cancers.  Pelvic exam and Pap test. This may be done every 3 years starting at age 52. Starting at age 52, this may be done every 5 years if you have a Pap test in combination with an HPV test. Talk with your health care provider about your test results, treatment  options, and if necessary, the need for more tests. Follow these instructions at home: Eating and drinking   Eat a diet that includes fresh fruits and vegetables, whole grains, lean protein, and low-fat dairy.  Take vitamin and mineral supplements as recommended by your health care provider.  Do not drink alcohol if: ? Your health care  provider tells you not to drink. ? You are pregnant, may be pregnant, or are planning to become pregnant.  If you drink alcohol: ? Limit how much you have to 0-1 drink a day. ? Be aware of how much alcohol is in your drink. In the [place] one drink equals one 12 oz bottle of beer (355 mL), one 5 oz glass of wine (148 mL), or one 1 oz glass of hard liquor (44 mL). Lifestyle  Take daily care of your teeth and gums.  Stay active. Exercise for at least 30 minutes on 5 or more days each week.  Do not use any products that contain nicotine or tobacco, such as cigarettes, e-cigarettes, and chewing tobacco. If you need help quitting, ask your health care provider.  If you are sexually active, practice safe sex. Use a condom or other form of birth control (contraception) in order to prevent pregnancy and STIs (sexually transmitted infections). If you plan to become pregnant, see your health care provider for a preconception visit. What's next?  Visit your health care provider once a year for a well check visit.  Ask your health care provider how often you should have your eyes and teeth checked.  Stay up to date on all vaccines. This information is not intended to replace advice given to you by your health care provider. Make sure you discuss any questions you have with your health care provider. Document Released: 08/31/2001 Document Revised: 03/16/2018 Document Reviewed: 03/16/2018 Elsevier Patient Education  2020 [employer].

## 2019-04-17 NOTE — Assessment & Plan Note (Signed)
Encouraged heart healthy diet, increase exercise, avoid trans fats, consider a krill oil cap daily 

## 2019-04-17 NOTE — Assessment & Plan Note (Signed)
Labs reveal deficiency. Start on Vitamin D 50000 IU caps, 1 cap po weekly x 12 weeks. Disp #4 with 4 rf. Also take daily Vitamin D over the counter. If already taking a daily supplement increase by 1000 IU daily and if not start Vitamin D 2000 IU daily.  

## 2019-04-17 NOTE — Progress Notes (Signed)
Subjective:    Patient ID: Edward QualiaUmara M Halvorson, female    DOB: 1981-06-11, 38 y.o.   MRN: [id number]  No chief complaint on file.   HPI Patient is in today for annual preventative exam and follow up on chronic medical concerns including anemia, hyperlipidemia, hyperglycemia and more. She feels well today and she denies any recent febrile illness or hospitalizations. She and her family are maintaining a solid quarantine state. She is trying to maintain a heart healthy diet and stay active. She is anxious about the state of the pandemic but she is managing well. Denies CP/palp/SOB/HA/congestion/fevers/GI or GU c/o. Taking meds as prescribed  Past Medical History:  Diagnosis Date  . Allergy   . Anemia   . Cervical radiculitis   . Heart murmur   . Hx of typhoid fever   . Hyperglycemia 04/15/2016  . Hyperlipidemia, mild 04/15/2016  . Left shoulder pain 04/15/2016  . Lightheadedness 11/04/2016  . Lumbar spine pain   . Tachycardia 11/04/2016  . Thoracic spine pain     Past Surgical History:  Procedure Laterality Date  . CESAREAN SECTION     2005  . HERNIA REPAIR     2007    Family History  Problem Relation Age of Onset  . Hypertension Mother   . Osteoarthritis Mother   . Hypertension Father   . Hyperlipidemia Father   . Diabetes Father   . Hypertension Maternal Grandmother   . Arthritis Maternal Grandmother   . Diabetes Maternal Grandfather   . Stroke Maternal Grandfather   . Hypertension Maternal Grandfather   . Hypertension Paternal Grandmother   . Diabetes Paternal Grandmother   . Cerebrovascular Accident Paternal Grandmother   . Hypertension Paternal Grandfather   . Cerebrovascular Accident Paternal Grandfather     Social History   Socioeconomic History  . Marital status: Married    Spouse name: Not on file  . Number of children: 2  . Years of education: BS degree  . Highest education level: Not on file  Occupational History  . Occupation: Self employeed  Social  Needs  . Financial resource strain: Not on file  . Food insecurity    Worry: Not on file    Inability: Not on file  . Transportation needs    Medical: Not on file    Non-medical: Not on file  Tobacco Use  . Smoking status: Never Smoker  . Smokeless tobacco: Never Used  Substance and Sexual Activity  . Alcohol use: No    Alcohol/week: 0.0 standard drinks  . Drug use: No  . Sexual activity: Yes  Lifestyle  . Physical activity    Days per week: Not on file    Minutes per session: Not on file  . Stress: Not on file  Relationships  . Social Musicianconnections    Talks on phone: Not on file    Gets together: Not on file    Attends religious service: Not on file    Active member of club or organization: Not on file    Attends meetings of clubs or organizations: Not on file    Relationship status: Not on file  . Intimate partner violence    Fear of current or ex partner: Not on file    Emotionally abused: Not on file    Physically abused: Not on file    Forced sexual activity: Not on file  Other Topics Concern  . 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 albuterol (VENTOLIN HFA) 108 (90 Base) MCG/ACT inhaler Inhale 2 puffs into the lungs every 6 (six) hours as needed for wheezing or shortness of breath. 18 g 2  . Ferrous Fumarate (HEMOCYTE) 324 (106 Fe) MG TABS tablet Take 1 tablet (106 mg of iron total) by mouth daily. 30 tablet 3  . montelukast (SINGULAIR) 10 MG tablet Take 1 tablet (10 mg total) by mouth at bedtime. 30 tablet 3   No facility-administered medications prior to visit.     Allergies  Allergen Reactions  . Flonase [Fluticasone  Propionate]     Anxious, restless    Review of Systems  Constitutional: Negative for chills, fever and malaise/fatigue.  HENT: Positive for congestion. Negative for hearing loss.   Eyes: Negative for discharge.  Respiratory: Negative for cough, sputum production and shortness of breath.   Cardiovascular: Negative for chest pain, palpitations and leg swelling.  Gastrointestinal: Negative for abdominal pain, blood in stool, constipation, diarrhea, heartburn, nausea and vomiting.  Genitourinary: Negative for dysuria, frequency, hematuria and urgency.  Musculoskeletal: Negative for back pain, falls and myalgias.  Skin: Negative for rash.  Neurological: Negative for dizziness, sensory change, loss of consciousness, weakness and headaches.  Endo/Heme/Allergies: Negative for environmental allergies. Does not bruise/bleed easily.  Psychiatric/Behavioral: Negative for depression and suicidal ideas. The patient is nervous/anxious. The patient does not have insomnia.        Objective:    Physical Exam Constitutional:      General: She is not in acute distress.    Appearance: She is not diaphoretic.  HENT:     Head: Normocephalic and atraumatic.     Right Ear: External ear normal.     Left Ear: External ear normal.     Nose: Nose normal.     Mouth/Throat:     Pharynx: No oropharyngeal exudate.  Eyes:     General: No scleral icterus.       Right eye: No discharge.        Left eye: No discharge.     Conjunctiva/sclera: Conjunctivae normal.     Pupils: Pupils are equal, round, and reactive to light.  Neck:     Musculoskeletal: Normal range of motion and neck supple.     Thyroid: No thyromegaly.  Cardiovascular:     Rate and Rhythm: Normal rate and regular rhythm.     Heart sounds: Normal heart sounds. No murmur.  Pulmonary:     Effort: Pulmonary effort is normal. No respiratory distress.     Breath sounds: Normal breath sounds. No wheezing or rales.  Abdominal:     General: Bowel  sounds are normal. There is no distension.     Palpations: Abdomen is soft. There is no mass.     Tenderness: There is no abdominal tenderness.  Musculoskeletal: Normal range of motion.        General: No tenderness.  Lymphadenopathy:     Cervical: No cervical adenopathy.  Skin:    General: Skin is warm and dry.     Findings: No rash.  Neurological:  Mental Status: She is alert and oriented to person, place, and time.     Cranial Nerves: No cranial nerve deficit.     Coordination: Coordination normal.     Deep Tendon Reflexes: Reflexes are normal and symmetric. Reflexes normal.     BP 108/68 (BP Location: Left Arm, Patient Position: Sitting, Cuff Size: Normal)   Pulse 75   Temp 97.7 F (36.5 C) (Temporal)   Resp 18   Wt 123 lb 12.8 oz (56.2 kg)   SpO2 97%   BMI 21.93 kg/m  Wt Readings from Last 3 Encounters:  04/17/19 123 lb 12.8 oz (56.2 kg)  04/03/18 121 lb 12.8 oz (55.2 kg)  03/28/18 121 lb (54.9 kg)    Diabetic Foot Exam - Simple   No data filed     Lab Results  Component Value Date   WBC 6.1 04/03/2019   HGB 9.9 (L) 04/03/2019   HCT 32.5 (L) 04/03/2019   PLT 241.0 04/03/2019   GLUCOSE 91 02/08/2019   CHOL 193 02/08/2019   TRIG 95.0 02/08/2019   HDL 46.30 02/08/2019   LDLCALC 127 (H) 02/08/2019   ALT 14 02/08/2019   AST 15 02/08/2019   NA 137 02/08/2019   K 4.2 02/08/2019   CL 107 02/08/2019   CREATININE 0.73 02/08/2019   BUN 10 02/08/2019   CO2 24 02/08/2019   TSH 1.74 02/08/2019   HGBA1C 5.5 11/04/2016    Lab Results  Component Value Date   TSH 1.74 02/08/2019   Lab Results  Component Value Date   WBC 6.1 04/03/2019   HGB 9.9 (L) 04/03/2019   HCT 32.5 (L) 04/03/2019   MCV 71.6 (L) 04/03/2019   PLT 241.0 04/03/2019   Lab Results  Component Value Date   NA 137 02/08/2019   K 4.2 02/08/2019   CO2 24 02/08/2019   GLUCOSE 91 02/08/2019   BUN 10 02/08/2019   CREATININE 0.73 02/08/2019   BILITOT 0.2 02/08/2019   ALKPHOS 76 02/08/2019    AST 15 02/08/2019   ALT 14 02/08/2019   PROT 7.2 02/08/2019   ALBUMIN 4.2 02/08/2019   CALCIUM 9.0 02/08/2019   GFR 88.56 02/08/2019   Lab Results  Component Value Date   CHOL 193 02/08/2019   Lab Results  Component Value Date   HDL 46.30 02/08/2019   Lab Results  Component Value Date   LDLCALC 127 (H) 02/08/2019   Lab Results  Component Value Date   TRIG 95.0 02/08/2019   Lab Results  Component Value Date   CHOLHDL 4 02/08/2019   Lab Results  Component Value Date   HGBA1C 5.5 11/04/2016       Assessment & Plan:   Problem List Items Addressed This Visit    Allergy    Has been doing better with staying home. Has been afraid to try the Singulair due to possible side effects. She is willing to try it now and she can also use Zyrtec bid, flonase daily and nasal saline prn      Hyperlipidemia, mild    Encouraged heart healthy diet, increase exercise, avoid trans fats, consider a krill oil cap daily      Hyperglycemia    hgba1c acceptable, minimize simple carbs. Increase exercise as tolerated.       Sacral pain   Preventative health care - Primary    Patient encouraged to maintain heart healthy diet, regular exercise, adequate sleep. Consider daily probiotics. Take medications as prescribed.  Labs reviewed and ordered.  Anemia    She continues to struggle with heavy menstrual bleeding and she is working with Dr Tenny Craw of OB/GYN and is trying not to take hormones. She is taking a vegan iron supplement and feels better. She notes her PICA has resolved. She will report any concerning symptoms for further evaluation and we will continue to monitor labs      Vitamin D deficiency    Labs reveal deficiency. Start on Vitamin D 91478 IU caps, 1 cap po weekly x 12 weeks. Disp #4 with 4 rf. Also take daily Vitamin D over the counter. If already taking a daily supplement increase by 1000 IU daily and if not start Vitamin D 2000 IU daily.          I am having Jethro Bolus. Hernandes start on Vitamin D (Ergocalciferol). I am also having her maintain her montelukast, albuterol, and Ferrous Fumarate.  Meds ordered this encounter  Medications  . Vitamin D, Ergocalciferol, (DRISDOL) 1.25 MG (50000 UT) CAPS capsule    Sig: Take 1 capsule (50,000 Units total) by mouth every 7 (seven) days.    Dispense:  4 capsule    Refill:  4     Danise Edge, MD

## 2019-04-17 NOTE — Assessment & Plan Note (Addendum)
Patient encouraged to maintain heart healthy diet, regular exercise, adequate sleep. Consider daily probiotics. Take medications as prescribed. Labs reviewed and ordered 

## 2019-04-17 NOTE — Assessment & Plan Note (Addendum)
Has been doing better with staying home. Has been afraid to try the Singulair due to possible side effects. She is willing to try it now and she can also use Zyrtec bid, flonase daily and nasal saline prn

## 2019-04-17 NOTE — Assessment & Plan Note (Signed)
She continues to struggle with heavy menstrual bleeding and she is working with Dr Harrington Challenger of OB/GYN and is trying not to take hormones. She is taking a vegan iron supplement and feels better. She notes her PICA has resolved. She will report any concerning symptoms for further evaluation and we will continue to monitor labs

## 2019-04-17 NOTE — Assessment & Plan Note (Signed)
hgba1c acceptable, minimize simple carbs. Increase exercise as tolerated.  

## 2019-04-18 ENCOUNTER — Emergency Department (HOSPITAL_BASED_OUTPATIENT_CLINIC_OR_DEPARTMENT_OTHER)
Admission: EM | Admit: 2019-04-18 | Discharge: 2019-04-18 | Disposition: A | Discharge status: Home / Self Care | Payer: No Typology Code available for payment source | Attending: Emergency Medicine | Admitting: Emergency Medicine

## 2019-04-18 ENCOUNTER — Emergency Department (HOSPITAL_BASED_OUTPATIENT_CLINIC_OR_DEPARTMENT_OTHER): Payer: No Typology Code available for payment source

## 2019-04-18 ENCOUNTER — Encounter (HOSPITAL_BASED_OUTPATIENT_CLINIC_OR_DEPARTMENT_OTHER): Payer: Self-pay | Admitting: Emergency Medicine

## 2019-04-18 ENCOUNTER — Ambulatory Visit: Payer: Self-pay | Admitting: *Deleted

## 2019-04-18 ENCOUNTER — Other Ambulatory Visit: Payer: Self-pay

## 2019-04-18 DIAGNOSIS — R011 Cardiac murmur, unspecified: Secondary | ICD-10-CM | POA: Diagnosis not present

## 2019-04-18 DIAGNOSIS — Z888 Allergy status to other drugs, medicaments and biological substances status: Secondary | ICD-10-CM | POA: Insufficient documentation

## 2019-04-18 DIAGNOSIS — R112 Nausea with vomiting, unspecified: Secondary | ICD-10-CM | POA: Diagnosis not present

## 2019-04-18 DIAGNOSIS — R109 Unspecified abdominal pain: Secondary | ICD-10-CM

## 2019-04-18 DIAGNOSIS — R1013 Epigastric pain: Secondary | ICD-10-CM | POA: Insufficient documentation

## 2019-04-18 DIAGNOSIS — E785 Hyperlipidemia, unspecified: Secondary | ICD-10-CM | POA: Insufficient documentation

## 2019-04-18 LAB — COMPREHENSIVE METABOLIC PANEL
ALT: 26 U/L (ref 0–44)
AST: 25 U/L (ref 15–41)
Albumin: 4.6 g/dL (ref 3.5–5.0)
Alkaline Phosphatase: 87 U/L (ref 38–126)
Anion gap: 9 (ref 5–15)
BUN: 12 mg/dL (ref 6–20)
CO2: 23 mmol/L (ref 22–32)
Calcium: 9.4 mg/dL (ref 8.9–10.3)
Chloride: 102 mmol/L (ref 98–111)
Creatinine, Ser: 0.63 mg/dL (ref 0.44–1.00)
GFR calc Af Amer: >60 mL/min (ref >60)
GFR calc non Af Amer: >60 mL/min (ref >60)
Glucose, Bld: 136 mg/dL — ABNORMAL HIGH (ref 70–99)
Potassium: 3.7 mmol/L (ref 3.5–5.1)
Sodium: 134 mmol/L — ABNORMAL LOW (ref 135–145)
Total Bilirubin: 0.5 mg/dL (ref 0.3–1.2)
Total Protein: 8.5 g/dL — ABNORMAL HIGH (ref 6.5–8.1)

## 2019-04-18 LAB — PREGNANCY, URINE: Preg Test, Ur: NEGATIVE

## 2019-04-18 LAB — LIPASE, BLOOD: Lipase: 43 U/L (ref 11–51)

## 2019-04-18 LAB — URINALYSIS, ROUTINE W REFLEX MICROSCOPIC
Bilirubin Urine: NEGATIVE
Glucose, UA: NEGATIVE mg/dL
Hgb urine dipstick: NEGATIVE
Ketones, ur: 15 mg/dL — AB
Leukocytes,Ua: NEGATIVE
Nitrite: NEGATIVE
Protein, ur: NEGATIVE mg/dL
Specific Gravity, Urine: >1.03 — ABNORMAL HIGH (ref 1.005–1.030)
pH: 5.5 (ref 5.0–8.0)

## 2019-04-18 LAB — CBC
HCT: 40.2 % (ref 36.0–46.0)
Hemoglobin: 11.6 g/dL — ABNORMAL LOW (ref 12.0–15.0)
MCH: 22.3 pg — ABNORMAL LOW (ref 26.0–34.0)
MCHC: 28.9 g/dL — ABNORMAL LOW (ref 30.0–36.0)
MCV: 77.3 fL — ABNORMAL LOW (ref 80.0–100.0)
Platelets: 275 10*3/uL (ref 150–400)
RBC: 5.2 MIL/uL — ABNORMAL HIGH (ref 3.87–5.11)
RDW: 22 % — ABNORMAL HIGH (ref 11.5–15.5)
WBC: 10.9 10*3/uL — ABNORMAL HIGH (ref 4.0–10.5)
nRBC: 0 % (ref 0.0–0.2)

## 2019-04-18 MED ORDER — ONDANSETRON 4 MG PO TBDP: 4.0000 mg | ORAL_TABLET | Freq: Three times a day (TID) | ORAL | 0 refills | Status: DC | PRN

## 2019-04-18 MED ORDER — PANTOPRAZOLE SODIUM 40 MG IV SOLR
40.0000 mg | Freq: Once | INTRAVENOUS | Status: AC
  Administered 2019-04-18: 19:00:00 40 mg via INTRAVENOUS
  Filled 2019-04-18: qty 40

## 2019-04-18 MED ORDER — SODIUM CHLORIDE 0.9 % IV BOLUS
1000.0000 mL | Freq: Once | INTRAVENOUS | Status: AC
  Administered 2019-04-18: 19:00:00 1000 mL via INTRAVENOUS

## 2019-04-18 MED ORDER — ALUM & MAG HYDROXIDE-SIMETH 200-200-20 MG/5ML PO SUSP
30.0000 mL | Freq: Once | ORAL | Status: AC
  Administered 2019-04-18: 21:00:00 30 mL via ORAL
  Filled 2019-04-18: qty 30

## 2019-04-18 MED ORDER — ONDANSETRON HCL 4 MG/2ML IJ SOLN
4.0000 mg | Freq: Once | INTRAMUSCULAR | Status: AC
  Administered 2019-04-18: 19:00:00 4 mg via INTRAVENOUS
  Filled 2019-04-18: qty 2

## 2019-04-18 MED ORDER — PANTOPRAZOLE SODIUM 20 MG PO TBEC: 20.0000 mg | DELAYED_RELEASE_TABLET | Freq: Every day | ORAL | 0 refills | Status: DC

## 2019-04-18 MED ORDER — LIDOCAINE VISCOUS HCL 2 % MT SOLN
15.0000 mL | Freq: Once | OROMUCOSAL | Status: AC
  Administered 2019-04-18: 15 mL via ORAL
  Filled 2019-04-18: qty 15

## 2019-04-18 MED ORDER — SODIUM CHLORIDE 0.9% FLUSH
3.0000 mL | Freq: Once | INTRAVENOUS | Status: DC
  Filled 2019-04-18: qty 3

## 2019-04-18 MED ORDER — SUCRALFATE 1 GM/10ML PO SUSP: 1.0000 g | Freq: Three times a day (TID) | ORAL | 0 refills | Status: DC

## 2019-04-18 MED ORDER — MORPHINE SULFATE (PF) 4 MG/ML IV SOLN
4.0000 mg | Freq: Once | INTRAVENOUS | Status: AC
  Administered 2019-04-18: 19:00:00 4 mg via INTRAVENOUS
  Filled 2019-04-18: qty 1

## 2019-04-18 NOTE — Discharge Instructions (Addendum)
You were seen in the emergency department today for abdominal pain.  Your ER work-up was overall reassuring.  Your labs showed that you are anemic but this is improved from prior labs you have had done.  Your labs also show that you are a bit dehydrated, please be sure to drink plenty of fluids.  Your ultrasound of your gallbladder did not show any abnormalities.  Is unclear exactly what caused her symptoms today, this could be due to gastritis or GERD, we are sending you home with the following medicines to help with your symptoms: -Protonix: This is an antiacid medication to take daily in the morning prior to meals -Carafate: This is a medicine to take prior to meals and prior to bedtime -Zofran: This is medicine to take every 8 hours as needed for nausea and vomiting.  We have prescribed you new medication(s) today. Discuss the medications prescribed today with your pharmacist as they can have adverse effects and interactions with your other medicines including over the counter and prescribed medications. Seek medical evaluation if you start to experience new or abnormal symptoms after taking one of these medicines, seek care immediately if you start to experience difficulty breathing, feeling of your throat closing, facial swelling, or rash as these could be indications of a more serious allergic reaction  Please follow attached diet recommendations.  Please follow-up with your primary care provider within 3 days.  Return to the ER for new or worsening symptoms including but not limited to worsening pain, change in location of pain, inability to keep fluids down, blood in vomit or stool, fever, or any other concerns.

## 2019-04-18 NOTE — Telephone Encounter (Signed)
Pt called in c/o sharp pains with a constant pain in her abd above her navel and around her navel.  No N/V/D.   Normal stools.   Has a history of C section in 2005 with a hernia repair same year to the left lower abd due to her C section.  I referred her to the ED however she has not tried taking anything.   She thinks it may be gas but it's not going away since it started at 10:00 this morning suddenly.   She did not want to go to the ED.  "I want to take something for the gas and see if that works".    "If I'm still hurting in an hour I will go to the emergency room".     I emphasized how important it was for her to go on to the ED if she is not better in an hour since she doesn't want to go until she tries something for gas.    She assured me she would go because it's very painful, if gas medicine not help.  "I just saw Dr. Charlett Blake yesterday for my check up and everything was fine then".   "This morning is when the pain started".   I sent these notes to Dr. Frederik Pear office.  Reason for Disposition . [1] MILD-MODERATE pain AND [2] constant AND [3] present > 2 hours  Answer Assessment - Initial Assessment Questions 1. LOCATION: "Where does it hurt?"      I'm having sharp pain above and around by belly button 2. RADIATION: "Does the pain shoot anywhere else?" (e.g., chest, back)     This morning.  3. ONSET: "When did the pain begin?" (e.g., minutes, hours or days ago)      This morning 4. SUDDEN: "Gradual or sudden onset?"     Suddenly 5. PATTERN "Does the pain come and go, or is it constant?"    - If constant: "Is it getting better, staying the same, or worsening?"      (Note: Constant means the pain never goes away completely; most serious pain is constant and it progresses)     - If intermittent: "How long does it last?" "Do you have pain now?"     (Note: Intermittent means the pain goes away completely between bouts)     Constant since this morning.    6. SEVERITY: "How bad is the  pain?"  (e.g., Scale 1-10; mild, moderate, or severe)   - MILD (1-3): doesn't interfere with normal activities, abdomen soft and not tender to touch    - MODERATE (4-7): interferes with normal activities or awakens from sleep, tender to touch    - SEVERE (8-10): excruciating pain, doubled over, unable to do any normal activities      9 with the sharp pains. It doubles me over when the sharp pains hit. 7. RECURRENT SYMPTOM: "Have you ever had this type of abdominal pain before?" If so, ask: "When was the last time?" and "What happened that time?"      Yes twice before.   About month ago.   I had a BM and that helped the pain. I've had a BM today.   I'm taking iron pills but I''ve not had a problem.   I'm having a BM twice a day which is my normal.    I saw Dr. Charlett Blake yesterday and she checked my stomach it was fine.   The pain didn't start until this morning at 10:00. 8. CAUSE: "  What do you think is causing the abdominal pain?"     Gas maybe 9. RELIEVING/AGGRAVATING FACTORS: "What makes it better or worse?" (e.g., movement, antacids, bowel movement)     I had a C section and hernia repair.   Hernia was on left side way under my belly button from my C section.   C section 2005.  2005 hernia repair. I've not taken anything for the gas. 10. OTHER SYMPTOMS: "Has there been any vomiting, diarrhea, constipation, or urine problems?"       No constipation or diarrhea. 11. PREGNANCY: "Is there any chance you are pregnant?" "When was your last menstrual period?"       No just had my period.  Protocols used: ABDOMINAL PAIN [facility]

## 2019-04-18 NOTE — ED Provider Notes (Signed)
Tara Stevenson   CSN: [id number] Arrival date & time: 04/18/19  1707     History   Chief Complaint Chief Complaint  Patient presents with   Abdominal Pain    HPI Tara Stevenson is a 38 y.o. female with a history of hyperlipidemia, anemia, anxiety, and prior C-section as well as hernia repair who presents to the emergency department with complaints of abdominal pain that began around 11:00 this morning.  Patient states the pain is located in the epigastrium, it is intermittent, sharp in nature when it occurs, no specific identifiable alleviating/aggravating factors or triggers.  Tara Stevenson had associated nausea with one episode of emesis.  Had a normal bowel movement after onset of pain.  Tara Stevenson tried taking Tums and Gas-X without relief.  Denies fever, chills, hematemesis, melena, hematochezia, diarrhea, dysuria, vaginal bleeding, or vaginal discharge.  Denies chest pain, dyspnea, or syncope.  Prior abdominal surgeries include a C-section as well as a incisional hernia repair.  LMP 04/02/2019.  No recent travel or antibiotics.     HPI  Past Medical History:  Diagnosis Date   Allergy    Anemia    Cervical radiculitis    Heart murmur    Hx of typhoid fever    Hyperglycemia 04/15/2016   Hyperlipidemia, mild 04/15/2016   Left shoulder pain 04/15/2016   Lightheadedness 11/04/2016   Lumbar spine pain    Tachycardia 11/04/2016   Thoracic spine pain     Patient Active Problem List   Diagnosis Date Noted   Preventative health care 04/17/2019   Anemia 04/17/2019   Vitamin D deficiency 04/17/2019   Sacral pain 01/28/2019   Hand injury, right, sequela 03/29/2018   Tachycardia 11/04/2016   Lightheadedness 11/04/2016   Left shoulder pain 04/15/2016   Hyperlipidemia, mild 04/15/2016   Hyperglycemia 04/15/2016   Allergy    Hx of typhoid fever    Anxiety state 06/26/2015    Past Surgical History:  Procedure Laterality Date    CESAREAN SECTION     2005   HERNIA REPAIR     2007     OB History   No obstetric history on file.      Home Medications    Prior to Admission medications   Medication Sig Start Date End Date Taking? Authorizing Provider  Vitamin D, Ergocalciferol, (DRISDOL) 1.25 MG (50000 UT) CAPS capsule Take 1 capsule (50,000 Units total) by mouth every 7 (seven) days. 04/17/19   Mosie Lukes, MD    Family History Family History  Problem Relation Age of Onset   Hypertension Mother    Osteoarthritis Mother    Hypertension Father    Hyperlipidemia Father    Diabetes Father    Hypertension Maternal Grandmother    Arthritis Maternal Grandmother    Diabetes Maternal Grandfather    Stroke Maternal Grandfather    Hypertension Maternal Grandfather    Hypertension Paternal Grandmother    Diabetes Paternal Grandmother    Cerebrovascular Accident Paternal Grandmother    Hypertension Paternal Grandfather    Cerebrovascular Accident Paternal Grandfather     Social History Social History   Tobacco Use   Smoking status: Never Smoker   Smokeless tobacco: Never Used  Substance Use Topics   Alcohol use: No    Alcohol/week: 0.0 standard drinks   Drug use: No     Allergies   Flonase [fluticasone propionate]   Review of Systems Review of Systems  Constitutional: Negative for chills, diaphoresis and fever.  Respiratory:  Negative for shortness of breath.   Cardiovascular: Negative for chest pain.  Gastrointestinal: Positive for abdominal pain, nausea and vomiting. Negative for anal bleeding, blood in stool, constipation and diarrhea.  Genitourinary: Negative for dysuria, vaginal bleeding and vaginal discharge.  Neurological: Negative for syncope.  All other systems reviewed and are negative.    Physical Exam Updated Vital Signs BP (!) 151/95 (BP Location: Right Arm)    Pulse 73    Temp 98.4 F (36.9 C) (Oral)    Resp 16    Ht 5\' 3"  (1.6 m)    Wt 55.2 kg    LMP  04/02/2019    SpO2 100%    BMI 21.54 kg/m   Physical Exam Vitals signs and nursing Stevenson reviewed.  Constitutional:      General: Tara Stevenson is not in acute distress.    Appearance: Tara Stevenson is well-developed. Tara Stevenson is not toxic-appearing.  HENT:     Head: Normocephalic and atraumatic.  Eyes:     General:        Right eye: No discharge.        Left eye: No discharge.     Conjunctiva/sclera: Conjunctivae normal.  Neck:     Musculoskeletal: Neck supple.  Cardiovascular:     Rate and Rhythm: Normal rate and regular rhythm.  Pulmonary:     Effort: Pulmonary effort is normal. No respiratory distress.     Breath sounds: Normal breath sounds. No wheezing, rhonchi or rales.  Abdominal:     General: There is no distension.     Palpations: Abdomen is soft.     Tenderness: There is abdominal tenderness in the epigastric area. There is no right CVA tenderness, left CVA tenderness, guarding or rebound. Negative signs include Murphy's sign and McBurney's sign.     Comments: Suprapubic region well healed surgical scar present.   Skin:    General: Skin is warm and dry.     Findings: No rash.  Neurological:     Mental Status: Tara Stevenson is alert.     Comments: Clear speech.   Psychiatric:        Behavior: Behavior normal.    ED Treatments / Results  Labs (all labs ordered are listed, but only abnormal results are displayed) Labs Reviewed  COMPREHENSIVE METABOLIC PANEL - Abnormal; Notable for the following components:      Result Value   Sodium 134 (*)    Glucose, Bld 136 (*)    Total Protein 8.5 (*)    All other components within normal limits  CBC - Abnormal; Notable for the following components:   WBC 10.9 (*)    RBC 5.20 (*)    Hemoglobin 11.6 (*)    MCV 77.3 (*)    MCH 22.3 (*)    MCHC 28.9 (*)    RDW 22.0 (*)    All other components within normal limits  URINALYSIS, ROUTINE W REFLEX MICROSCOPIC - Abnormal; Notable for the following components:   APPearance CLOUDY (*)    Specific Gravity,  Urine >1.030 (*)    Ketones, ur 15 (*)    All other components within normal limits  LIPASE, BLOOD  PREGNANCY, URINE    EKG None  Radiology 04/04/2019 Abdomen Limited Ruq  Result Date: 04/18/2019 CLINICAL DATA:  Supraumbilical and right upper quadrant pain EXAM: ULTRASOUND ABDOMEN LIMITED RIGHT UPPER QUADRANT COMPARISON:  None. FINDINGS: Gallbladder: No gallstones or wall thickening visualized. Sonographic 04/20/2019 sign is reportedly negative by the sonographer though patient was pre-medicated prior to this examination  limiting the diagnostic utility of this finding. Common bile duct: Diameter: 3 mm, nondilated Liver: No focal lesion identified. Within normal limits in parenchymal echogenicity. Portal vein is patent on color Doppler imaging with normal direction of blood flow towards the liver. Other: None. IMPRESSION: Normal appearance of the gallbladder. Negative sonographic Eulah PontMurphy sign is of limited diagnostic utility in the setting of premedication. Right upper quadrant ultrasound is otherwise unremarkable. Electronically Signed   By: Kreg ShropshirePrice  DeHay M.D.   On: 04/18/2019 20:36    Procedures Procedures (including critical care time)  Medications Ordered in ED Medications  sodium chloride flush (NS) 0.9 % injection 3 mL (3 mLs Intravenous Not Given 04/18/19 1822)     Initial Impression / Assessment and Plan / ED Course  I have reviewed the triage vital signs and the nursing notes.  Pertinent labs & imaging results that were available during my care of the patient were reviewed by me and considered in my medical decision making (see chart for details).    Patient presents to the ED with complaints of abdominal pain. Patient nontoxic appearing, in no apparent distress, vitals WNL other than initially elevated BP which has normalized. On exam patient tender to epigastrium, no peritoneal signs.   ER work-up per triage thus far reviewed:  CBC: Very mild leukocytosis @ 10.9, anemia improved from  prior.  CMP: No significant electrolyte derangement- mild hyponatremia. Renal function WNL. LFTs WNL Lipase: WNL UA: No UTI, dehydration with ketonuria & elevated specific gravity Preg test: negative  Analgesics, anti-emetics, and fluids administered.  Will further assess w/ RUQ US.   RUQ US: IMPRESSION: Normal appearance of the gallbladder. Negative sonographic Eulah PontMurphy sign is of limited diagnostic utility in the setting of premedication. Right upper quadrant ultrasound is otherwise unremarkable  21:30: RE-EVAL: Patient feeling much better, tolerating PO, feels comfortable going home.   Unclear definitive etiology, pn repeat abdominal exam patient remains without peritoneal signs, doubt cholecystitis, pancreatitis, diverticulitis, appendicitis, bowel obstruction/perforation,  PID, or ectopic pregnancy. Patient tolerating PO in the emergency department. Will discharge home with supportive measures. I discussed results, treatment plan, need for PCP follow-up, and return precautions with the patient. Provided opportunity for questions, patient confirmed understanding and is in agreement with plan.   Findings and plan of care discussed with supervising physician Dr. Clarene DukeLittle who is in agreement.   Final Clinical Impressions(s) / ED Diagnoses   Final diagnoses:  Abdominal pain    ED Discharge Orders         Ordered    ondansetron (ZOFRAN ODT) 4 MG disintegrating tablet  Every 8 hours PRN     04/18/19 2134    pantoprazole (PROTONIX) 20 MG tablet  Daily     04/18/19 2134    sucralfate (CARAFATE) 1 GM/10ML suspension  3 times daily with meals & bedtime     04/18/19 2134           Cherly Andersonetrucelli, Whitlee Sluder R, PA-C 04/18/19 2136    Little, Ambrose Finlandachel Morgan, MD 04/19/19 0002

## 2019-04-18 NOTE — ED Triage Notes (Signed)
Abd pain just above Navel since 10 this morning.  Vomited x1. No diarrhea.

## 2019-04-27 ENCOUNTER — Encounter: Payer: Self-pay | Admitting: Family Medicine

## 2019-05-04 ENCOUNTER — Other Ambulatory Visit: Payer: Self-pay

## 2019-05-04 ENCOUNTER — Ambulatory Visit (INDEPENDENT_AMBULATORY_CARE_PROVIDER_SITE_OTHER): Payer: No Typology Code available for payment source

## 2019-05-04 DIAGNOSIS — Z23 Encounter for immunization: Secondary | ICD-10-CM

## 2019-05-28 ENCOUNTER — Encounter: Payer: Self-pay | Admitting: Family Medicine

## 2019-05-28 ENCOUNTER — Other Ambulatory Visit: Payer: Self-pay

## 2019-05-28 ENCOUNTER — Ambulatory Visit (INDEPENDENT_AMBULATORY_CARE_PROVIDER_SITE_OTHER): Payer: No Typology Code available for payment source | Admitting: Family Medicine

## 2019-05-28 VITALS — BP 122/80 | HR 75 | Temp 97.3°F | Resp 15 | Ht 63.0 in | Wt 121.0 lb

## 2019-05-28 DIAGNOSIS — M7711 Lateral epicondylitis, right elbow: Secondary | ICD-10-CM

## 2019-05-28 MED ORDER — MELOXICAM 7.5 MG PO TABS: 7.5000 mg | ORAL_TABLET | Freq: Every day | ORAL | 0 refills | Status: DC

## 2019-05-28 MED ORDER — METHOCARBAMOL 500 MG PO TABS: 500.0000 mg | ORAL_TABLET | Freq: Every evening | ORAL | 0 refills | Status: DC | PRN

## 2019-05-28 NOTE — Patient Instructions (Signed)
It was nice to see you today!  I think that you have lateral epicondylitis, or tennis elbow. This is a common but very annoying condition.   Try using mobic daily for a couple of week, and the robaxin as needed for night time pain A tennis elbow strap, relative rest, and ice can also help. Please see hand-out  If not improving in the next few weeks please alert me

## 2019-05-28 NOTE — Progress Notes (Signed)
[facility] [address], [place], [place] [address] [phone]  Date:  05/28/2019   Name:  Tara Stevenson   DOB:  Aug 07, 1980   MRN:  [id number]  PCP:  Mosie Lukes, MD    Chief Complaint: Arm Pain (2 weeks, feeling in elbow, no known injry woke up with pain)   History of Present Illness:  Tara Stevenson is a 38 y.o. very pleasant female patient who presents with the following:  Patient of Dr. Charlett Blake with history of hyperlipidemia and hyperglycemia Here today with concern of arm pain for a couple of weeks  She has noted some pain in her right forearm/ elbow a couple of weeks ago-at the lateral aspect of the elbow She is right handed and is having some pain with day-to-day tasks For example, she was not able to carve the pumpkin at halloween She is not able to recall any injury They did get some new appliances in her home, but she did not really strain herself during this installation.  She is not aware of any other new activity or strain on her arm.  She tried taking an Aleve once, this helped a bit Never had this before No other injuries  She is married, has 19 and 28 year old sons.  No chance of current pregnancy  She is otherwise feeling ok  Flu shot is up-to-date  Patient Active Problem List   Diagnosis Date Noted  . Preventative health care 04/17/2019  . Anemia 04/17/2019  . Vitamin D deficiency 04/17/2019  . Sacral pain 01/28/2019  . Hand injury, right, sequela 03/29/2018  . Tachycardia 11/04/2016  . Lightheadedness 11/04/2016  . Left shoulder pain 04/15/2016  . Hyperlipidemia, mild 04/15/2016  . Hyperglycemia 04/15/2016  . Allergy   . Hx of typhoid fever   . Anxiety state 06/26/2015    Past Medical History:  Diagnosis Date  . Allergy   . Anemia   . Cervical radiculitis   . Heart murmur   . Hx of typhoid fever   . Hyperglycemia 04/15/2016  . Hyperlipidemia, mild 04/15/2016  . Left  shoulder pain 04/15/2016  . Lightheadedness 11/04/2016  . Lumbar spine pain   . Tachycardia 11/04/2016  . Thoracic spine pain     Past Surgical History:  Procedure Laterality Date  . CESAREAN SECTION     2005  . HERNIA REPAIR     2007    Social History   Tobacco Use  . Smoking status: Never Smoker  . Smokeless tobacco: Never Used  Substance Use Topics  . Alcohol use: No    Alcohol/week: 0.0 standard drinks  . Drug use: No    Family History  Problem Relation Age of Onset  . Hypertension Mother   . Osteoarthritis Mother   . Hypertension Father   . Hyperlipidemia Father   . Diabetes Father   . Hypertension Maternal Grandmother   . Arthritis Maternal Grandmother   . Diabetes Maternal Grandfather   . Stroke Maternal Grandfather   . Hypertension Maternal Grandfather   . Hypertension Paternal Grandmother   . Diabetes Paternal Grandmother   . Cerebrovascular Accident Paternal Grandmother   . Hypertension Paternal Grandfather   . Cerebrovascular Accident Paternal Grandfather     Allergies  Allergen Reactions  . Flonase [Fluticasone Propionate]     Anxious, restless    Medication list has been reviewed and updated.  Current Outpatient Medications on File Prior to Visit  Medication Sig Dispense Refill  . pantoprazole (PROTONIX) 20 MG tablet Take 1 tablet (20 mg total) by mouth daily. 14 tablet 0  . sucralfate (CARAFATE) 1 GM/10ML suspension Take 10 mLs (1 g total) by mouth 4 (four) times daily -  with meals and at bedtime. 420 mL 0  . Vitamin D, Ergocalciferol, (DRISDOL) 1.25 MG (50000 UT) CAPS capsule Take 1 capsule (50,000 Units total) by mouth every 7 (seven) days. 4 capsule 4   No current facility-administered medications on file prior to visit.     Review of Systems:  As per HPI- otherwise negative.  No fever or chills, no chest pain or shortness of breath Physical Examination: Vitals:   05/28/19 1423  BP: 122/80  Pulse: 75  Resp: 15  Temp: (!) 97.3 F  (36.3 C)  SpO2: 98%   Vitals:   05/28/19 1423  Weight: 121 lb (54.9 kg)  Height: 5\' 3"  (1.6 m)   Body mass index is 21.43 kg/m. Ideal Body Weight: Weight in (lb) to have BMI = 25: 140.8  GEN: WDWN, NAD, Non-toxic, A & O x 3, slim build, looks well HEENT: Atraumatic, Normocephalic. Neck supple. No masses, No LAD. Ears and Nose: No external deformity. CV: RRR, No M/G/R. No JVD. No thrill. No extra heart sounds. PULM: CTA B, no wheezes, crackles, rhonchi. No retractions. No resp. distress. No accessory muscle use. EXTR: No c/c/e NEURO Normal gait.  PSYCH: Normally interactive. Conversant. Not depressed or anxious appearing.  Calm demeanor.  She has tenderness over the muscles adjacent to the right lateral epicondyle and the bony epicondyle.  No pain with elbow flexion, extension, pronation or supination.  She does have mild discomfort with resisted pronation and supination on the right only.  Hand and wrist are normal.  No redness, swelling, bruising, skin lesion   Assessment and Plan: Right lateral epicondylitis - Plan: meloxicam (MOBIC) 7.5 MG tablet, methocarbamol (ROBAXIN) 500 MG tablet  Suspect lateral epicondylitis.  We will have her take meloxicam daily for about 2 weeks, can use Robaxin at bedtime if needed for pain.  Also suggested ice, tennis elbow strap, relative rest.  If not better in the next 2 or 3 weeks will refer to physical therapy or orthopedics She states understanding and agreement with plan  Signed , MD

## 2019-07-10 ENCOUNTER — Other Ambulatory Visit: Payer: No Typology Code available for payment source

## 2019-07-17 ENCOUNTER — Ambulatory Visit: Payer: No Typology Code available for payment source | Admitting: Family Medicine

## 2019-08-06 ENCOUNTER — Other Ambulatory Visit: Payer: No Typology Code available for payment source

## 2019-08-20 ENCOUNTER — Ambulatory Visit: Payer: Self-pay | Admitting: Family Medicine

## 2019-09-10 ENCOUNTER — Other Ambulatory Visit: Payer: Self-pay | Admitting: Family Medicine

## 2019-09-12 ENCOUNTER — Other Ambulatory Visit: Payer: Self-pay | Admitting: Family Medicine

## 2019-09-12 DIAGNOSIS — D508 Other iron deficiency anemias: Secondary | ICD-10-CM

## 2019-09-12 DIAGNOSIS — E559 Vitamin D deficiency, unspecified: Secondary | ICD-10-CM

## 2019-09-12 DIAGNOSIS — R739 Hyperglycemia, unspecified: Secondary | ICD-10-CM

## 2019-09-12 DIAGNOSIS — E785 Hyperlipidemia, unspecified: Secondary | ICD-10-CM

## 2019-09-12 DIAGNOSIS — R000 Tachycardia, unspecified: Secondary | ICD-10-CM

## 2019-09-14 ENCOUNTER — Other Ambulatory Visit: Payer: Self-pay

## 2019-09-14 ENCOUNTER — Other Ambulatory Visit (INDEPENDENT_AMBULATORY_CARE_PROVIDER_SITE_OTHER): Payer: Self-pay

## 2019-09-14 DIAGNOSIS — R739 Hyperglycemia, unspecified: Secondary | ICD-10-CM

## 2019-09-14 DIAGNOSIS — D508 Other iron deficiency anemias: Secondary | ICD-10-CM

## 2019-09-14 DIAGNOSIS — R000 Tachycardia, unspecified: Secondary | ICD-10-CM

## 2019-09-14 DIAGNOSIS — E785 Hyperlipidemia, unspecified: Secondary | ICD-10-CM

## 2019-09-14 DIAGNOSIS — E559 Vitamin D deficiency, unspecified: Secondary | ICD-10-CM

## 2019-09-15 LAB — TSH: TSH: 1.67 mIU/L

## 2019-09-15 LAB — COMPREHENSIVE METABOLIC PANEL
AG Ratio: 1.3 (calc) (ref 1.0–2.5)
ALT: 15 U/L (ref 6–29)
AST: 15 U/L (ref 10–30)
Albumin: 4.1 g/dL (ref 3.6–5.1)
Alkaline phosphatase (APISO): 78 U/L (ref 31–125)
BUN: 13 mg/dL (ref 7–25)
CO2: 25 mmol/L (ref 20–32)
Calcium: 9.6 mg/dL (ref 8.6–10.2)
Chloride: 102 mmol/L (ref 98–110)
Creat: 0.71 mg/dL (ref 0.50–1.10)
Globulin: 3.1 g/dL (calc) (ref 1.9–3.7)
Glucose, Bld: 97 mg/dL (ref 65–99)
Potassium: 4 mmol/L (ref 3.5–5.3)
Sodium: 137 mmol/L (ref 135–146)
Total Bilirubin: 0.3 mg/dL (ref 0.2–1.2)
Total Protein: 7.2 g/dL (ref 6.1–8.1)

## 2019-09-15 LAB — CBC WITH DIFFERENTIAL/PLATELET
Absolute Monocytes: 471 cells/uL (ref 200–950)
Basophils Absolute: 19 cells/uL (ref 0–200)
Basophils Relative: 0.3 %
Eosinophils Absolute: 112 cells/uL (ref 15–500)
Eosinophils Relative: 1.8 %
HCT: 33.4 % — ABNORMAL LOW (ref 35.0–45.0)
Hemoglobin: 10.1 g/dL — ABNORMAL LOW (ref 11.7–15.5)
Lymphs Abs: 1686 cells/uL (ref 850–3900)
MCH: 22.2 pg — ABNORMAL LOW (ref 27.0–33.0)
MCHC: 30.2 g/dL — ABNORMAL LOW (ref 32.0–36.0)
MCV: 73.6 fL — ABNORMAL LOW (ref 80.0–100.0)
MPV: 10.8 fL (ref 7.5–12.5)
Monocytes Relative: 7.6 %
Neutro Abs: 3912 cells/uL (ref 1500–7800)
Neutrophils Relative %: 63.1 %
Platelets: 314 10*3/uL (ref 140–400)
RBC: 4.54 10*6/uL (ref 3.80–5.10)
RDW: 14.3 % (ref 11.0–15.0)
Total Lymphocyte: 27.2 %
WBC: 6.2 10*3/uL (ref 3.8–10.8)

## 2019-09-15 LAB — LIPID PANEL
Cholesterol: 243 mg/dL — ABNORMAL HIGH (ref <200)
HDL: 44 mg/dL — ABNORMAL LOW (ref >=50)
LDL Cholesterol (Calc): 166 mg/dL (calc) — ABNORMAL HIGH
Non-HDL Cholesterol (Calc): 199 mg/dL (calc) — ABNORMAL HIGH (ref <130)
Total CHOL/HDL Ratio: 5.5 (calc) — ABNORMAL HIGH (ref <5.0)
Triglycerides: 180 mg/dL — ABNORMAL HIGH (ref <150)

## 2019-09-15 LAB — VITAMIN D 25 HYDROXY (VIT D DEFICIENCY, FRACTURES): Vit D, 25-Hydroxy: 52 ng/mL (ref 30–100)

## 2019-09-15 LAB — HEMOGLOBIN A1C
Hgb A1c MFr Bld: 5.6 % of total Hgb (ref <5.7)
Mean Plasma Glucose: 114 (calc)
eAG (mmol/L): 6.3 (calc)

## 2019-09-15 LAB — IRON,TIBC AND FERRITIN PANEL
%SAT: 2 % (calc) — ABNORMAL LOW (ref 16–45)
Ferritin: <1 ng/mL — ABNORMAL LOW (ref 16–154)
Iron: 13 ug/dL — ABNORMAL LOW (ref 40–190)
TIBC: 522 mcg/dL (calc) — ABNORMAL HIGH (ref 250–450)

## 2019-09-17 MED ORDER — FERROUS FUMARATE 325 (106 FE) MG PO TABS: 1.0000 | ORAL_TABLET | Freq: Every day | ORAL | 3 refills | Status: DC

## 2019-09-17 NOTE — Addendum Note (Signed)
Addended by: Crissie Sickles A on: 09/17/2019 11:54 AM   Modules accepted: Orders

## 2019-09-25 ENCOUNTER — Other Ambulatory Visit: Payer: Self-pay

## 2019-09-25 ENCOUNTER — Ambulatory Visit (INDEPENDENT_AMBULATORY_CARE_PROVIDER_SITE_OTHER): Payer: Self-pay | Admitting: Family Medicine

## 2019-09-25 VITALS — BP 148/87 | HR 81 | Temp 97.7°F

## 2019-09-25 DIAGNOSIS — R739 Hyperglycemia, unspecified: Secondary | ICD-10-CM

## 2019-09-25 DIAGNOSIS — D508 Other iron deficiency anemias: Secondary | ICD-10-CM

## 2019-09-25 DIAGNOSIS — F411 Generalized anxiety disorder: Secondary | ICD-10-CM

## 2019-09-25 DIAGNOSIS — M25521 Pain in right elbow: Secondary | ICD-10-CM

## 2019-09-25 DIAGNOSIS — E785 Hyperlipidemia, unspecified: Secondary | ICD-10-CM

## 2019-09-25 DIAGNOSIS — E559 Vitamin D deficiency, unspecified: Secondary | ICD-10-CM

## 2019-09-25 NOTE — Assessment & Plan Note (Signed)
Encouraged heart healthy diet, increase exercise, avoid trans fats, consider a krill oil cap daily 

## 2019-09-25 NOTE — Patient Instructions (Addendum)
[employer] Blood Pressure cuff, upper arm, want BP 100-140/60-90 Pulse oximeter, want oxygen in 90s  Aspercreme lidocaine gel roll on twice a day and ice twice a day Tennis Elbow Tennis elbow is swelling (inflammation) in your outer forearm, near your elbow. Swelling affects the tissues that connect muscle to bone (tendons). Tennis elbow can happen in any sport or job in which you use your elbow too much. It is caused by doing the same motion over and over. Tennis elbow can cause:  Pain and tenderness in your forearm and the outer part of your elbow. You may have pain all the time, or only when using the arm.  A burning feeling. This runs from your elbow through your arm.  Weak grip in your hand. Follow these instructions at home: Activity  Rest your elbow and wrist. Avoid activities that cause problems, as told by your doctor.  If told by your doctor, wear an elbow strap to reduce stress on the area.  Do physical therapy exercises as told.  If you lift an object, lift it with your palm facing up. This is easier on your elbow. Lifestyle  If your tennis elbow is caused by sports, check your equipment and make sure that: ? You are using it correctly. ? It fits you well.  If your tennis elbow is caused by work or by using a computer, take breaks often to stretch your arm. Talk with your manager about how you can manage your condition at work. If you have a brace:  Wear the brace as told by your doctor. Remove it only as told by your doctor.  Loosen the brace if your fingers tingle, get numb, or turn cold and blue.  Keep the brace clean.  If the brace is not waterproof, ask your doctor if you may take the brace off for bathing. If you must keep the brace on while bathing: ? Do not let it get wet. ? Cover it with a watertight covering when you take a bath or a shower. General instructions   If told, put ice on the painful area: ? Put ice in a plastic bag. ? Place a towel between  your skin and the bag. ? Leave the ice on for 20 minutes, 2-3 times a day.  Take over-the-counter and prescription medicines only as told by your doctor.  Keep all follow-up visits as told by your doctor. This is important. Contact a doctor if:  Your pain does not get better with treatment.  Your pain gets worse.  You have weakness in your forearm, hand, or fingers.  You cannot feel your forearm, hand, or fingers. Summary  Tennis elbow is swelling (inflammation) in your outer forearm, near your elbow.  Tennis elbow is caused by doing the same motion over and over.  Rest your elbow and wrist. Avoid activities that cause problems, as told by your doctor.  If told, put ice on the painful area for 20 minutes, 2-3 times a day. This information is not intended to replace advice given to you by your health care provider. Make sure you discuss any questions you have with your health care provider. Document Revised: 03/31/2018 Document Reviewed: 04/19/2017 Elsevier Patient Education  [place].  Weekly vitals  Take Multivitamin with minerals, selenium Vitamin D 1000-2000 IU daily Probiotic with lactobacillus and bifidophilus Asprin EC 81 mg daily  Melatonin 2-5 mg at bedtime  https://garcia.net/ ToxicBlast.pl  The mRNA technology has been in development for 20 years and we already  had the Coronavirus family of viruses (which usually just cause the common cold) genetically mapped already which is why we were able to come up with viable vaccine candidates so quickly in stage 1, then stage 2 scientifically took the correct amount of time what we did to speed it up was just build the manufacturing platform at the same time we were running the experiments so if it worked we could produce faster. And stage 3 has now had many months and millions of people immunized and we are seeing the immunity hold for over 9 months now with sign of it dissipating and no  significant numbers of adverse reactions.  During every flu season we see 2 anaphylactic reactions for every million shots given and we initially thought we would see 11 per million with the COVID vaccine but now we see only 2-3 with Moderna and 5 or so with Pfizer so compared to someone is dying every 20 minutes from COVID and more deadly and infectious strains are coming it is definitely best when weighing the risks and benefits to take the shots.  Another pooled analysis of the 5 most utilized vaccines in the world shows that after full immunization so far no one has died from COVID.

## 2019-09-25 NOTE — Assessment & Plan Note (Addendum)
She was not taking her iron supplements and she was on her cycle which was very heavy at the time of lab work. She has restarted her iron and plans to make an appointment with gynecology to discuss her options. She has heavy cycles and bleeding between cycles as well.

## 2019-09-25 NOTE — Assessment & Plan Note (Signed)
hgba1c acceptable, minimize simple carbs. Increase exercise as tolerated.  

## 2019-09-26 DIAGNOSIS — M25521 Pain in right elbow: Secondary | ICD-10-CM | POA: Insufficient documentation

## 2019-09-26 NOTE — Progress Notes (Addendum)
Virtual Visit via Video Note  I connected with Tara Stevenson on 09/25/19 at  1:40 PM EST by a video enabled telemedicine application and verified that I am speaking with the correct person using two identifiers.  Location: Patient: home Provider: office   I discussed the limitations of evaluation and management by telemedicine and the availability of in person appointments. The patient expressed understanding and agreed to proceed. Kem Boroughs, CMA was able to get the patient set up on a visit, video   Subjective:    Patient ID: Tara Stevenson, female    DOB: [birth year], 39 y.o.   MRN: [id number]  Chief Complaint  Patient presents with  . 3 month follow up    HPI Patient is in today for follow up on chronic medical concerns. No recent febrile illness or hospitalizations. She is maintaining quarantine well and staying home with her children. She is noting some right elbow pain. No fall or trauma. No redness or warmth. Persistent over a month. She continues to have heavy menstrual cycles with bleeding imbetween. Denies CP/palp/SOB/HA/congestion/fevers/GI or GU c/o. Taking meds as prescribed  Past Medical History:  Diagnosis Date  . Allergy   . Anemia   . Cervical radiculitis   . Heart murmur   . Hx of typhoid fever   . Hyperglycemia 04/15/2016  . Hyperlipidemia, mild 04/15/2016  . Left shoulder pain 04/15/2016  . Lightheadedness 11/04/2016  . Lumbar spine pain   . Tachycardia 11/04/2016  . Thoracic spine pain     Past Surgical History:  Procedure Laterality Date  . CESAREAN SECTION     2005  . HERNIA REPAIR     2007    Family History  Problem Relation Age of Onset  . Hypertension Mother   . Osteoarthritis Mother   . Hypertension Father   . Hyperlipidemia Father   . Diabetes Father   . Hypertension Maternal Grandmother   . Arthritis Maternal Grandmother   . Diabetes Maternal Grandfather   . Stroke Maternal Grandfather   . Hypertension Maternal Grandfather   .  Hypertension Paternal Grandmother   . Diabetes Paternal Grandmother   . Cerebrovascular Accident Paternal Grandmother   . Hypertension Paternal Grandfather   . Cerebrovascular Accident Paternal Grandfather     Social History   Socioeconomic History  . Marital status: Married    Spouse name: Not on file  . Number of children: 2  . Years of education: BS degree  . Highest education level: Not on file  Occupational History  . Occupation: Self employeed  Tobacco Use  . Smoking status: Never Smoker  . Smokeless tobacco: Never Used  Substance and Sexual Activity  . Alcohol use: No    Alcohol/week: 0.0 standard drinks  . Drug use: No  . Sexual activity: Yes    Birth control/protection: None  Other Topics Concern  . 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 Difficulty of Paying Living Expenses: Not on file  Food Insecurity:   . Worried About Charity fundraiser in  the Last Year: Not on file  . Ran Out of Food in the Last Year: Not on file  Transportation Needs:   . Lack of Transportation (Medical): Not on file  . Lack of Transportation (Non-Medical): Not on file  Physical Activity:   . Days of Exercise per Week: Not on file  . Minutes of Exercise per Session: Not on file  Stress:   . Feeling of Stress : Not on file  Social Connections:   . Frequency of Communication with Friends and Family: Not on file  . Frequency of Social Gatherings with Friends and Family: Not on file  . Attends Religious Services: Not on file  . Active Member of Clubs or Organizations: Not on file  . Attends Banker Meetings: Not on file  . Marital Status: Not on file    Intimate Partner Violence:   . Fear of Current or Ex-Partner: Not on file  . Emotionally Abused: Not on file  . Physically Abused: Not on file  . Sexually Abused: Not on file    Outpatient Medications Prior to Visit  Medication Sig Dispense Refill  . ferrous fumarate (HEMOCYTE - 106 MG FE) 325 (106 Fe) MG TABS tablet Take 1 tablet (106 mg of iron total) by mouth daily. 30 tablet 3  . KURVELO 0.15-30 MG-MCG tablet Take 1 tablet by mouth daily.    . Vitamin D, Ergocalciferol, (DRISDOL) 1.25 MG (50000 UNIT) CAPS capsule TAKE 1 CAPSULE BY MOUTH EVERY 7 DAYS 4 capsule 4  . meloxicam (MOBIC) 7.5 MG tablet Take 1 tablet (7.5 mg total) by mouth daily. Use as needed for elbow pain 30 tablet 0  . methocarbamol (ROBAXIN) 500 MG tablet Take 1 tablet (500 mg total) by mouth at bedtime as needed for muscle spasms. 20 tablet 0  . pantoprazole (PROTONIX) 20 MG tablet Take 1 tablet (20 mg total) by mouth daily. 14 tablet 0  . sucralfate (CARAFATE) 1 GM/10ML suspension Take 10 mLs (1 g total) by mouth 4 (four) times daily -  with meals and at bedtime. 420 mL 0   No facility-administered medications prior to visit.    Allergies  Allergen Reactions  . Flonase [Fluticasone Propionate]     Anxious, restless    Review of Systems  Constitutional: Negative for fever and malaise/fatigue.  HENT: Negative for congestion.   Eyes: Negative for blurred vision.  Respiratory: Negative for shortness of breath.   Cardiovascular: Negative for chest pain, palpitations and leg swelling.  Gastrointestinal: Negative for abdominal pain, blood in stool and nausea.  Genitourinary: Negative for dysuria and frequency.  Musculoskeletal: Positive for joint pain. Negative for falls.  Skin: Negative for rash.  Neurological: Negative for dizziness, loss of consciousness and headaches.  Endo/Heme/Allergies: Negative for environmental allergies.  Psychiatric/Behavioral: Negative for depression. The patient is not  nervous/anxious.        Objective:    Physical Exam Constitutional:      Appearance: Normal appearance. She is not ill-appearing.  HENT:     Head: Normocephalic and atraumatic.  Eyes:     General:        Right eye: No discharge.        Left eye: No discharge.  Pulmonary:     Effort: Pulmonary effort is normal.  Neurological:     Mental Status: She is alert and oriented to person, place, and time.  Psychiatric:        Behavior: Behavior normal.     BP (!) 148/87  Pulse 81   Temp 97.7 F (36.5 C)   SpO2 100%  Wt Readings from Last 3 Encounters:  05/28/19 121 lb (54.9 kg)  04/18/19 121 lb 9.6 oz (55.2 kg)  04/17/19 123 lb 12.8 oz (56.2 kg)    Diabetic Foot Exam - Simple   No data filed     Lab Results  Component Value Date   WBC 6.2 09/14/2019   HGB 10.1 (L) 09/14/2019   HCT 33.4 (L) 09/14/2019   PLT 314 09/14/2019   GLUCOSE 97 09/14/2019   CHOL 243 (H) 09/14/2019   TRIG 180 (H) 09/14/2019   HDL 44 (L) 09/14/2019   LDLCALC 166 (H) 09/14/2019   ALT 15 09/14/2019   AST 15 09/14/2019   NA 137 09/14/2019   K 4.0 09/14/2019   CL 102 09/14/2019   CREATININE 0.71 09/14/2019   BUN 13 09/14/2019   CO2 25 09/14/2019   TSH 1.67 09/14/2019   HGBA1C 5.6 09/14/2019    Lab Results  Component Value Date   TSH 1.67 09/14/2019   Lab Results  Component Value Date   WBC 6.2 09/14/2019   HGB 10.1 (L) 09/14/2019   HCT 33.4 (L) 09/14/2019   MCV 73.6 (L) 09/14/2019   PLT 314 09/14/2019   Lab Results  Component Value Date   NA 137 09/14/2019   K 4.0 09/14/2019   CO2 25 09/14/2019   GLUCOSE 97 09/14/2019   BUN 13 09/14/2019   CREATININE 0.71 09/14/2019   BILITOT 0.3 09/14/2019   ALKPHOS 87 04/18/2019   AST 15 09/14/2019   ALT 15 09/14/2019   PROT 7.2 09/14/2019   ALBUMIN 4.6 04/18/2019   CALCIUM 9.6 09/14/2019   ANIONGAP 9 04/18/2019   GFR 88.56 02/08/2019   Lab Results  Component Value Date   CHOL 243 (H) 09/14/2019   Lab Results  Component Value  Date   HDL 44 (L) 09/14/2019   Lab Results  Component Value Date   LDLCALC 166 (H) 09/14/2019   Lab Results  Component Value Date   TRIG 180 (H) 09/14/2019   Lab Results  Component Value Date   CHOLHDL 5.5 (H) 09/14/2019   Lab Results  Component Value Date   HGBA1C 5.6 09/14/2019       Assessment & Plan:   Problem List Items Addressed This Visit    Anxiety state    She is managing stress well mostly by staying in with her family during the pandemic.       Hyperlipidemia, mild    Encouraged heart healthy diet, increase exercise, avoid trans fats, consider a krill oil cap daily      Relevant Orders   Comprehensive metabolic panel   Lipid panel   TSH   Hyperglycemia    hgba1c acceptable, minimize simple carbs. Increase exercise as tolerated.       Relevant Orders   Comprehensive metabolic panel   TSH   Anemia    She was not taking her iron supplements and she was on her cycle which was very heavy at the time of lab work. She has restarted her iron and plans to make an appointment with gynecology to discuss her options. She has heavy cycles and bleeding between cycles as well.       Relevant Orders   Comprehensive metabolic panel   CBC   TSH   Vitamin D deficiency - Primary    Supplement and monitor      Relevant Orders   TSH   VITAMIN  D 25 Hydroxy (Vit-D Deficiency, Fractures)   Elbow pain, right    Likely olecranon bursitis. Encouraged icing, topical lidocaine bid and no heavy lifting. If no improvement she will let us know for referral to sports medicine         I have discontinued Jini M. Condie's pantoprazole, sucralfate, meloxicam, and methocarbamol. I am also having her maintain her Vitamin D (Ergocalciferol), ferrous fumarate, and Kurvelo.  No orders of the defined types were placed in this encounter.    I discussed the assessment and treatment plan with the patient. The patient was provided an opportunity to ask questions and all were  answered. The patient agreed with the plan and demonstrated an understanding of the instructions.   The patient was advised to call back or seek an in-person evaluation if the symptoms worsen or if the condition fails to improve as anticipated.  I provided 25 minutes of non-face-to-face time during this encounter.   Danise Edge, MD

## 2019-09-26 NOTE — Assessment & Plan Note (Signed)
Likely olecranon bursitis. Encouraged icing, topical lidocaine bid and no heavy lifting. If no improvement she will let us know for referral to sports medicine

## 2019-09-26 NOTE — Assessment & Plan Note (Signed)
Supplement and monitor 

## 2019-09-26 NOTE — Assessment & Plan Note (Signed)
She is managing stress well mostly by staying in with her family during the pandemic.

## 2019-12-20 ENCOUNTER — Other Ambulatory Visit: Payer: Self-pay

## 2019-12-27 ENCOUNTER — Ambulatory Visit: Payer: Self-pay | Admitting: Family Medicine

## 2020-03-02 ENCOUNTER — Other Ambulatory Visit: Payer: Self-pay | Admitting: Family Medicine

## 2020-04-09 ENCOUNTER — Telehealth: Payer: Self-pay | Admitting: Family Medicine

## 2020-04-09 NOTE — Telephone Encounter (Signed)
Do you want Pt to continue Ergocalciferol?  

## 2020-04-09 NOTE — Telephone Encounter (Signed)
Patient notified, and will take OTC as suggested and not to take Ergocalciferol.

## 2020-04-09 NOTE — Telephone Encounter (Signed)
No I would prefer she stop it and take OTC 08-2998 IU daily and then we recheck her level in 3-6 months. I will refuse the prescription. Please let the patient know.

## 2020-11-04 ENCOUNTER — Telehealth: Payer: Self-pay | Admitting: Family Medicine

## 2021-02-02 ENCOUNTER — Telehealth (INDEPENDENT_AMBULATORY_CARE_PROVIDER_SITE_OTHER): Payer: No Typology Code available for payment source | Admitting: Family Medicine

## 2021-02-02 ENCOUNTER — Other Ambulatory Visit: Payer: Self-pay

## 2021-02-02 DIAGNOSIS — E785 Hyperlipidemia, unspecified: Secondary | ICD-10-CM

## 2021-02-02 DIAGNOSIS — E559 Vitamin D deficiency, unspecified: Secondary | ICD-10-CM | POA: Diagnosis not present

## 2021-02-02 DIAGNOSIS — D508 Other iron deficiency anemias: Secondary | ICD-10-CM

## 2021-02-02 DIAGNOSIS — R000 Tachycardia, unspecified: Secondary | ICD-10-CM | POA: Diagnosis not present

## 2021-02-02 DIAGNOSIS — R739 Hyperglycemia, unspecified: Secondary | ICD-10-CM

## 2021-02-02 DIAGNOSIS — M25562 Pain in left knee: Secondary | ICD-10-CM

## 2021-02-02 DIAGNOSIS — F4321 Adjustment disorder with depressed mood: Secondary | ICD-10-CM

## 2021-02-03 ENCOUNTER — Other Ambulatory Visit (INDEPENDENT_AMBULATORY_CARE_PROVIDER_SITE_OTHER): Payer: No Typology Code available for payment source

## 2021-02-03 ENCOUNTER — Other Ambulatory Visit: Payer: Self-pay

## 2021-02-03 DIAGNOSIS — R000 Tachycardia, unspecified: Secondary | ICD-10-CM | POA: Diagnosis not present

## 2021-02-03 DIAGNOSIS — D508 Other iron deficiency anemias: Secondary | ICD-10-CM | POA: Diagnosis not present

## 2021-02-03 DIAGNOSIS — E785 Hyperlipidemia, unspecified: Secondary | ICD-10-CM

## 2021-02-03 DIAGNOSIS — R739 Hyperglycemia, unspecified: Secondary | ICD-10-CM

## 2021-02-03 LAB — HEMOGLOBIN A1C: Hgb A1c MFr Bld: 5.6 % (ref 4.6–6.5)

## 2021-02-03 LAB — COMPREHENSIVE METABOLIC PANEL
ALT: 13 U/L (ref 0–35)
AST: 14 U/L (ref 0–37)
Albumin: 4.2 g/dL (ref 3.5–5.2)
Alkaline Phosphatase: 59 U/L (ref 39–117)
BUN: 12 mg/dL (ref 6–23)
CO2: 27 mEq/L (ref 19–32)
Calcium: 9.3 mg/dL (ref 8.4–10.5)
Chloride: 104 mEq/L (ref 96–112)
Creatinine, Ser: 0.76 mg/dL (ref 0.40–1.20)
GFR: 97.33 mL/min (ref >60.00)
Glucose, Bld: 97 mg/dL (ref 70–99)
Potassium: 3.9 mEq/L (ref 3.5–5.1)
Sodium: 138 mEq/L (ref 135–145)
Total Bilirubin: 0.3 mg/dL (ref 0.2–1.2)
Total Protein: 7.3 g/dL (ref 6.0–8.3)

## 2021-02-03 LAB — CBC
HCT: 37.9 % (ref 36.0–46.0)
Hemoglobin: 12.1 g/dL (ref 12.0–15.0)
MCHC: 32 g/dL (ref 30.0–36.0)
MCV: 77.1 fl — ABNORMAL LOW (ref 78.0–100.0)
Platelets: 254 10*3/uL (ref 150.0–400.0)
RBC: 4.91 Mil/uL (ref 3.87–5.11)
RDW: 19.4 % — ABNORMAL HIGH (ref 11.5–15.5)
WBC: 5 10*3/uL (ref 4.0–10.5)

## 2021-02-03 LAB — LIPID PANEL
Cholesterol: 230 mg/dL — ABNORMAL HIGH (ref 0–200)
HDL: 47.5 mg/dL (ref >39.00)
LDL Cholesterol: 159 mg/dL — ABNORMAL HIGH (ref 0–99)
NonHDL: 182.43
Total CHOL/HDL Ratio: 5
Triglycerides: 118 mg/dL (ref 0.0–149.0)
VLDL: 23.6 mg/dL (ref 0.0–40.0)

## 2021-02-03 LAB — TSH: TSH: 1.51 u[IU]/mL (ref 0.35–5.50)

## 2021-02-04 DIAGNOSIS — F4321 Adjustment disorder with depressed mood: Secondary | ICD-10-CM | POA: Insufficient documentation

## 2021-02-04 DIAGNOSIS — M25561 Pain in right knee: Secondary | ICD-10-CM | POA: Insufficient documentation

## 2021-02-04 DIAGNOSIS — M25562 Pain in left knee: Secondary | ICD-10-CM | POA: Insufficient documentation

## 2021-02-04 LAB — IRON,TIBC AND FERRITIN PANEL
%SAT: 8 % (calc) — ABNORMAL LOW (ref 16–45)
Ferritin: 3 ng/mL — ABNORMAL LOW (ref 16–232)
Iron: 38 ug/dL — ABNORMAL LOW (ref 40–190)
TIBC: 492 mcg/dL (calc) — ABNORMAL HIGH (ref 250–450)

## 2021-02-04 NOTE — Assessment & Plan Note (Signed)
She is only taking her iron sporadically. Check labs

## 2021-02-04 NOTE — Assessment & Plan Note (Signed)
She has been having left knee pain for about 3 months. No fall or trauma. She notes it locks when she is walking downstairs at times. Encouraged moist heat and gentle stretching as tolerated. May try NSAIDs and prescription meds as directed and report if symptoms worsen or seek immediate care. If it worsens she will let us know so we can refer for further consideration.

## 2021-02-04 NOTE — Assessment & Plan Note (Signed)
hgba1c acceptable, minimize simple carbs. Increase exercise as tolerated.  

## 2021-02-04 NOTE — Assessment & Plan Note (Signed)
Supplement and monitor 

## 2021-02-04 NOTE — Assessment & Plan Note (Signed)
Encourage heart healthy diet such as MIND or DASH diet, increase exercise, avoid trans fats, simple carbohydrates and processed foods, consider a krill or fish or flaxseed oil cap daily.  °

## 2021-02-04 NOTE — Assessment & Plan Note (Signed)
Her mother died of metastatic ovarian cancer in December and she is still very sad. She continues to care for her own family and her father so she keeps busy and feels she is managing. She will let us know if she is ready for counseling or medications.

## 2021-02-04 NOTE — Progress Notes (Signed)
MyChart Video Visit    Virtual Visit via Video Note   This visit type was conducted due to national recommendations for restrictions regarding the COVID-19 Pandemic (e.g. social distancing) in an effort to limit this patient's exposure and mitigate transmission in our community. This patient is at least at moderate risk for complications without adequate follow up. This format is felt to be most appropriate for this patient at this time. Physical exam was limited by quality of the video and audio technology used for the visit. S Chism, CMA was able to get the patient set up on a video visit.  Patient location: home Patient and provider in visit Provider location: Office  I discussed the limitations of evaluation and management by telemedicine and the availability of in person appointments. The patient expressed understanding and agreed to proceed.  Visit Date: 02/02/2021  Today's healthcare provider: Danise EdgeStacey Tara Gluth, MD     Subjective:    Patient ID: Tara Stevenson, female    DOB: Aug 06, 1980, 40 y.o.   MRN: [id number]  Chief Complaint  Patient presents with   Follow-up    HPI Patient is in today for follow up on chronic medical cocnerns. No recent febrile illness or hospitalizations. She is very sad secondary to the loss of her mother to metastatic uterine cancer at age 41. She is staying busy but is definitely sad. Notes fatigue and malaise to some extent. Denies CP/palp/SOB/HA/congestion/fevers/GI or GU c/o. Taking meds as prescribed. She is noting some left knee pain for the past 3 months or so. No recent fall or trauma. No redness, warmth or swelling. She notes it locks going down stairs at times. Denies CP/palp/SOB/HA/congestion/fevers/GI or GU c/o. Taking meds as prescribed   Past Medical History:  Diagnosis Date   Allergy    Anemia    Cervical radiculitis    Heart murmur    Hx of typhoid fever    Hyperglycemia 04/15/2016   Hyperlipidemia, mild 04/15/2016   Left shoulder  pain 04/15/2016   Lightheadedness 11/04/2016   Lumbar spine pain    Tachycardia 11/04/2016   Thoracic spine pain     Past Surgical History:  Procedure Laterality Date   CESAREAN SECTION     2005   HERNIA REPAIR     2007    Family History  Problem Relation Age of Onset   Hypertension Mother    Osteoarthritis Mother    Hypertension Father    Hyperlipidemia Father    Diabetes Father    Hypertension Maternal Grandmother    Arthritis Maternal Grandmother    Diabetes Maternal Grandfather    Stroke Maternal Grandfather    Hypertension Maternal Grandfather    Hypertension Paternal Grandmother    Diabetes Paternal Grandmother    Cerebrovascular Accident Paternal Grandmother    Hypertension Paternal Grandfather    Cerebrovascular Accident Paternal Grandfather     Social History   Socioeconomic History   Marital status: Married    Spouse name: Not on file   Number of children: 2   Years of education: BS degree   Highest education level: Not on file  Occupational History   Occupation: Self employeed  Tobacco Use   Smoking status: Never   Smokeless tobacco: Never  Substance and Sexual Activity   Alcohol use: No    Alcohol/week: 0.0 standard drinks   Drug use: No   Sexual activity: Yes    Birth control/protection: None  Other Topics Concern   Not on file  Social History Narrative  Pt married / 2 children (sons 2005 and 2007)   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     ferrous fumarate (HEMOCYTE - 106 MG FE) 325 (106 Fe) MG TABS tablet Take 1 tablet (106 mg of iron total) by mouth daily. (Patient not taking: Reported on 02/02/2021) 30 tablet 3   Vitamin D, Ergocalciferol, (DRISDOL) 1.25 MG (50000 UNIT) CAPS capsule TAKE 1 CAPSULE BY MOUTH EVERY 7 DAYS (Patient not taking: Reported on 02/02/2021) 4 capsule 0   No facility-administered medications prior to visit.    Allergies  Allergen Reactions   Flonase [Fluticasone Propionate]     Anxious, restless    Review of Systems  Constitutional:  Positive for malaise/fatigue. Negative for fever.  HENT:  Negative for congestion.   Eyes:  Negative for blurred vision.  Respiratory:  Negative for shortness of breath.   Cardiovascular:  Negative for chest pain, palpitations and leg swelling.  Gastrointestinal:  Negative for abdominal pain, blood in stool and nausea.  Genitourinary:  Negative for dysuria and frequency.  Musculoskeletal:  Positive for joint pain. Negative for falls.  Skin:  Negative for rash.  Neurological:  Negative for dizziness, loss of consciousness and headaches.  Endo/Heme/Allergies:  Negative for environmental allergies.  Psychiatric/Behavioral:  Positive for depression. The patient is nervous/anxious.       Objective:    Physical Exam Constitutional:      General: She is not in acute distress.    Appearance: Normal appearance. She is not ill-appearing or toxic-appearing.  HENT:     Head: Normocephalic and atraumatic.     Right Ear: External ear normal.     Left Ear: External ear normal.     Nose: Nose normal.  Eyes:     General:        Right eye: No discharge.        Left eye: No discharge.  Pulmonary:     Effort: Pulmonary effort is normal.  Skin:    Findings: No rash.  Neurological:     Mental Status: She is alert and oriented to person, place, and time.  Psychiatric:        Behavior: Behavior  normal.    There were no vitals taken for this visit. Wt Readings from Last 3 Encounters:  05/28/19 121 lb (54.9 kg)  04/18/19 121 lb 9.6 oz (55.2 kg)  04/17/19 123 lb 12.8 oz (56.2 kg)    Diabetic Foot Exam - Simple   No data filed    Lab Results  Component Value Date   WBC 5.0 02/03/2021   HGB 12.1 02/03/2021   HCT 37.9 02/03/2021   PLT 254.0 02/03/2021   GLUCOSE 97 02/03/2021   CHOL 230 (H) 02/03/2021   TRIG 118.0 02/03/2021   HDL 47.50 02/03/2021  LDLCALC 159 (H) 02/03/2021   ALT 13 02/03/2021   AST 14 02/03/2021   NA 138 02/03/2021   K 3.9 02/03/2021   CL 104 02/03/2021   CREATININE 0.76 02/03/2021   BUN 12 02/03/2021   CO2 27 02/03/2021   TSH 1.51 02/03/2021   HGBA1C 5.6 02/03/2021    Lab Results  Component Value Date   TSH 1.51 02/03/2021   Lab Results  Component Value Date   WBC 5.0 02/03/2021   HGB 12.1 02/03/2021   HCT 37.9 02/03/2021   MCV 77.1 (L) 02/03/2021   PLT 254.0 02/03/2021   Lab Results  Component Value Date   NA 138 02/03/2021   K 3.9 02/03/2021   CO2 27 02/03/2021   GLUCOSE 97 02/03/2021   BUN 12 02/03/2021   CREATININE 0.76 02/03/2021   BILITOT 0.3 02/03/2021   ALKPHOS 59 02/03/2021   AST 14 02/03/2021   ALT 13 02/03/2021   PROT 7.3 02/03/2021   ALBUMIN 4.2 02/03/2021   CALCIUM 9.3 02/03/2021   ANIONGAP 9 04/18/2019   GFR 97.33 02/03/2021   Lab Results  Component Value Date   CHOL 230 (H) 02/03/2021   Lab Results  Component Value Date   HDL 47.50 02/03/2021   Lab Results  Component Value Date   LDLCALC 159 (H) 02/03/2021   Lab Results  Component Value Date   TRIG 118.0 02/03/2021   Lab Results  Component Value Date   CHOLHDL 5 02/03/2021   Lab Results  Component Value Date   HGBA1C 5.6 02/03/2021       Assessment & Plan:   Problem List Items Addressed This Visit     Hyperlipidemia, mild - Primary    Encourage heart healthy diet such as MIND or DASH diet, increase exercise, avoid trans fats,  simple carbohydrates and processed foods, consider a krill or fish or flaxseed oil cap daily.        Relevant Orders   Lipid panel (Completed)   Hyperglycemia    hgba1c acceptable, minimize simple carbs. Increase exercise as tolerated.        Relevant Orders   Comprehensive metabolic panel (Completed)   TSH (Completed)   Hemoglobin A1c (Completed)   Tachycardia   Relevant Orders   TSH (Completed)   Anemia    She is only taking her iron sporadically. Check labs       Relevant Orders   Iron, TIBC and Ferritin Panel (Completed)   CBC (Completed)   Vitamin D deficiency    Supplement and monitor       Grief reaction    Her mother died of metastatic ovarian cancer in December and she is still very sad. She continues to care for her own family and her father so she keeps busy and feels she is managing. She will let us know if she is ready for counseling or medications.        Left knee pain    She has been having left knee pain for about 3 months. No fall or trauma. She notes it locks when she is walking downstairs at times. Encouraged moist heat and gentle stretching as tolerated. May try NSAIDs and prescription meds as directed and report if symptoms worsen or seek immediate care. If it worsens she will let us know so we can refer for further consideration.         I am having Jethro Bolus. Leija maintain her ferrous fumarate, Kurvelo, and Vitamin D (Ergocalciferol).  No orders of the  defined types were placed in this encounter.   I discussed the assessment and treatment plan with the patient. The patient was provided an opportunity to ask questions and all were answered. The patient agreed with the plan and demonstrated an understanding of the instructions.   The patient was advised to call back or seek an in-person evaluation if the symptoms worsen or if the condition fails to improve as anticipated.  I provided 22 minutes of face-to-face time during this  encounter.   Danise Edge, MD [facility] [phone] (phone) [phone] (fax)  [facility]

## 2021-02-12 ENCOUNTER — Ambulatory Visit: Payer: Self-pay | Admitting: Family Medicine

## 2021-04-22 ENCOUNTER — Other Ambulatory Visit: Payer: Self-pay

## 2021-04-23 ENCOUNTER — Encounter: Payer: Self-pay | Admitting: Family Medicine

## 2021-04-23 ENCOUNTER — Ambulatory Visit (HOSPITAL_BASED_OUTPATIENT_CLINIC_OR_DEPARTMENT_OTHER)
Admission: RE | Admit: 2021-04-23 | Discharge: 2021-04-23 | Disposition: A | Discharge status: Home / Self Care | Payer: No Typology Code available for payment source | Source: Ambulatory Visit | Attending: Family Medicine | Admitting: Family Medicine

## 2021-04-23 ENCOUNTER — Ambulatory Visit (INDEPENDENT_AMBULATORY_CARE_PROVIDER_SITE_OTHER): Payer: No Typology Code available for payment source | Admitting: Family Medicine

## 2021-04-23 VITALS — BP 100/64 | HR 81 | Temp 98.3°F | Resp 16 | Ht 63.5 in | Wt 128.8 lb

## 2021-04-23 DIAGNOSIS — M25562 Pain in left knee: Secondary | ICD-10-CM | POA: Insufficient documentation

## 2021-04-23 DIAGNOSIS — R739 Hyperglycemia, unspecified: Secondary | ICD-10-CM

## 2021-04-23 DIAGNOSIS — M25561 Pain in right knee: Secondary | ICD-10-CM

## 2021-04-23 DIAGNOSIS — Z1159 Encounter for screening for other viral diseases: Secondary | ICD-10-CM

## 2021-04-23 DIAGNOSIS — M549 Dorsalgia, unspecified: Secondary | ICD-10-CM | POA: Insufficient documentation

## 2021-04-23 DIAGNOSIS — D508 Other iron deficiency anemias: Secondary | ICD-10-CM

## 2021-04-23 DIAGNOSIS — E785 Hyperlipidemia, unspecified: Secondary | ICD-10-CM | POA: Diagnosis not present

## 2021-04-23 DIAGNOSIS — G8929 Other chronic pain: Secondary | ICD-10-CM

## 2021-04-23 DIAGNOSIS — F411 Generalized anxiety disorder: Secondary | ICD-10-CM

## 2021-04-23 DIAGNOSIS — Z23 Encounter for immunization: Secondary | ICD-10-CM | POA: Diagnosis not present

## 2021-04-23 DIAGNOSIS — Z0000 Encounter for general adult medical examination without abnormal findings: Secondary | ICD-10-CM | POA: Diagnosis not present

## 2021-04-23 DIAGNOSIS — E559 Vitamin D deficiency, unspecified: Secondary | ICD-10-CM

## 2021-04-23 DIAGNOSIS — M25512 Pain in left shoulder: Secondary | ICD-10-CM

## 2021-04-23 LAB — CBC WITH DIFFERENTIAL/PLATELET
Basophils Absolute: 0 10*3/uL (ref 0.0–0.1)
Basophils Relative: 0.3 % (ref 0.0–3.0)
Eosinophils Absolute: 0.1 10*3/uL (ref 0.0–0.7)
Eosinophils Relative: 1.4 % (ref 0.0–5.0)
HCT: 37.7 % (ref 36.0–46.0)
Hemoglobin: 12 g/dL (ref 12.0–15.0)
Lymphocytes Relative: 30.7 % (ref 12.0–46.0)
Lymphs Abs: 1.7 10*3/uL (ref 0.7–4.0)
MCHC: 31.9 g/dL (ref 30.0–36.0)
MCV: 79.8 fl (ref 78.0–100.0)
Monocytes Absolute: 0.4 10*3/uL (ref 0.1–1.0)
Monocytes Relative: 7.5 % (ref 3.0–12.0)
Neutro Abs: 3.2 10*3/uL (ref 1.4–7.7)
Neutrophils Relative %: 60.1 % (ref 43.0–77.0)
Platelets: 241 10*3/uL (ref 150.0–400.0)
RBC: 4.73 Mil/uL (ref 3.87–5.11)
RDW: 15.2 % (ref 11.5–15.5)
WBC: 5.4 10*3/uL (ref 4.0–10.5)

## 2021-04-23 LAB — COMPREHENSIVE METABOLIC PANEL
ALT: 13 U/L (ref 0–35)
AST: 17 U/L (ref 0–37)
Albumin: 4.5 g/dL (ref 3.5–5.2)
Alkaline Phosphatase: 71 U/L (ref 39–117)
BUN: 12 mg/dL (ref 6–23)
CO2: 27 mEq/L (ref 19–32)
Calcium: 9.7 mg/dL (ref 8.4–10.5)
Chloride: 103 mEq/L (ref 96–112)
Creatinine, Ser: 0.78 mg/dL (ref 0.40–1.20)
GFR: 94.2 mL/min (ref >60.00)
Glucose, Bld: 88 mg/dL (ref 70–99)
Potassium: 4.1 mEq/L (ref 3.5–5.1)
Sodium: 137 mEq/L (ref 135–145)
Total Bilirubin: 0.3 mg/dL (ref 0.2–1.2)
Total Protein: 7.7 g/dL (ref 6.0–8.3)

## 2021-04-23 LAB — HEMOGLOBIN A1C: Hgb A1c MFr Bld: 5.8 % (ref 4.6–6.5)

## 2021-04-23 LAB — LIPID PANEL
Cholesterol: 246 mg/dL — ABNORMAL HIGH (ref 0–200)
HDL: 46.3 mg/dL (ref >39.00)
LDL Cholesterol: 173 mg/dL — ABNORMAL HIGH (ref 0–99)
NonHDL: 200.1
Total CHOL/HDL Ratio: 5
Triglycerides: 136 mg/dL (ref 0.0–149.0)
VLDL: 27.2 mg/dL (ref 0.0–40.0)

## 2021-04-23 LAB — TSH: TSH: 1.53 u[IU]/mL (ref 0.35–5.50)

## 2021-04-23 NOTE — Progress Notes (Signed)
Opened in error

## 2021-04-23 NOTE — Assessment & Plan Note (Signed)
hgba1c acceptable, minimize simple carbs. Increase exercise as tolerated.  

## 2021-04-23 NOTE — Assessment & Plan Note (Signed)
Mid to low back pain. She has had trouble here off and on since her last epidural 13 years ago. For past month pressure and numbness have been worse. Worse when she sits. Check xrays and referred to ortho for evaluation

## 2021-04-23 NOTE — Assessment & Plan Note (Signed)
She notes more weakness in right knee and pain in left knee. No recent injury, redness or warmth. Referred to ortho for further consideration

## 2021-04-23 NOTE — Assessment & Plan Note (Signed)
Increase leafy greens, consider increased lean red meat and using cast iron cookware. Continue to monitor, report any concerns 

## 2021-04-23 NOTE — Patient Instructions (Addendum)
- Paxlovid and/or molnupiravir is the new COVID medication we can give you if you get COVID so make sure you test if you have symptoms because we have to treat by day 5 of symptoms for it to be effective. If you are positive let us know so we can treat. If a home test is negative and your symptoms are persistent get a PCR test. Can check testing locations at [email or web address] If you are positive we will make an appointment with [place] and we will send in Paxlovid/molnupiravir if you would like it. Check with your pharmacy before we meet to confirm they have it in stock, if they do not then we can get the prescription at the [facility].   - Bivalent covid booster is offered at [facility] Mon-Fri, 9am-3pm and they do walk-ins.  ECASA 81 mg daily  Preventive Care 43-80 Years Old, Female Preventive care refers to lifestyle choices and visits with your health care provider that can promote health and wellness. This includes: A yearly physical exam. This is also called an annual wellness visit. Regular dental and eye exams. Immunizations. Screening for certain conditions. Healthy lifestyle choices, such as: Eating a healthy diet. Getting regular exercise. Not using drugs or products that contain nicotine and tobacco. Limiting alcohol use. What can I expect for my preventive care visit? Physical exam Your health care provider may check your: Height and weight. These may be used to calculate your BMI (body mass index). BMI is a measurement that tells if you are at a healthy weight. Heart rate and blood pressure. Body temperature. Skin for abnormal spots. Counseling Your health care provider may ask you questions about your: Past medical problems. Family's medical history. Alcohol, tobacco, and drug use. Emotional well-being. Home life and relationship well-being. Sexual activity. Diet, exercise, and sleep habits. Work and work Statistician. Access to  firearms. Method of birth control. Menstrual cycle. Pregnancy history. What immunizations do I need? Vaccines are usually given at various ages, according to a schedule. Your health care provider will recommend vaccines for you based on your age, medical history, and lifestyle or other factors, such as travel or where you work. What tests do I need? Blood tests Lipid and cholesterol levels. These may be checked every 5 years starting at age 58. Hepatitis C test. Hepatitis B test. Screening Diabetes screening. This is done by checking your blood sugar (glucose) after you have not eaten for a while (fasting). STD (sexually transmitted disease) testing, if you are at risk. BRCA-related cancer screening. This may be done if you have a family history of breast, ovarian, tubal, or peritoneal cancers. Pelvic exam and Pap test. This may be done every 3 years starting at age 54. Starting at age 34, this may be done every 5 years if you have a Pap test in combination with an HPV test. Talk with your health care provider about your test results, treatment options, and if necessary, the need for more tests. Follow these instructions at home: Eating and drinking  Eat a healthy diet that includes fresh fruits and vegetables, whole grains, lean protein, and low-fat dairy products. Take vitamin and mineral supplements as recommended by your health care provider. Do not drink alcohol if: Your health care provider tells you not to drink. You are pregnant, may be pregnant, or are planning to become pregnant. If you drink alcohol: Limit how much you have to 0-1 drink a day. Be aware of how much alcohol is in your  drink. In the [place] one drink equals one 12 oz bottle of beer (355 mL), one 5 oz glass of wine (148 mL), or one 1 oz glass of hard liquor (44 mL). Lifestyle Take daily care of your teeth and gums. Brush your teeth every morning and night with fluoride toothpaste. Floss one time each day. Stay  active. Exercise for at least 30 minutes 5 or more days each week. Do not use any products that contain nicotine or tobacco, such as cigarettes, e-cigarettes, and chewing tobacco. If you need help quitting, ask your health care provider. Do not use drugs. If you are sexually active, practice safe sex. Use a condom or other form of protection to prevent STIs (sexually transmitted infections). If you do not wish to become pregnant, use a form of birth control. If you plan to become pregnant, see your health care provider for a prepregnancy visit. Find healthy ways to cope with stress, such as: Meditation, yoga, or listening to music. Journaling. Talking to a trusted person. Spending time with friends and family. Safety Always wear your seat belt while driving or riding in a vehicle. Do not drive: If you have been drinking alcohol. Do not ride with someone who has been drinking. When you are tired or distracted. While texting. Wear a helmet and other protective equipment during sports activities. If you have firearms in your house, make sure you follow all gun safety procedures. Seek help if you have been physically or sexually abused. What's next? Go to your health care provider once a year for an annual wellness visit. Ask your health care provider how often you should have your eyes and teeth checked. Stay up to date on all vaccines. This information is not intended to replace advice given to you by your health care provider. Make sure you discuss any questions you have with your health care provider. Document Revised: 09/12/2020 Document Reviewed: 03/16/2018 Elsevier Patient Education  2022 [employer].

## 2021-04-23 NOTE — Assessment & Plan Note (Signed)
For years, unchanged. Encouraged moist heat and gentle stretching as tolerated. May try NSAIDs and prescription meds as directed and report if symptoms worsen or seek immediate care

## 2021-04-23 NOTE — Assessment & Plan Note (Signed)
Encourage heart healthy diet such as MIND or DASH diet, increase exercise, avoid trans fats, simple carbohydrates and processed foods, consider a krill or fish or flaxseed oil cap daily.  °

## 2021-04-23 NOTE — Assessment & Plan Note (Signed)
Still struggling with the grief of loosing her mother in December 2021 she declines any medical changes. She feels she is managing well enough

## 2021-04-23 NOTE — Assessment & Plan Note (Signed)
Patient encouraged to maintain heart healthy diet, regular exercise, adequate sleep. Consider daily probiotics. Take medications as prescribed. Labs ordered and reviewed. Follows with [facility], reports she is up todate

## 2021-04-23 NOTE — Progress Notes (Signed)
Patient ID: Tara Stevenson, female    DOB: June 07, 1981  Age: 40 y.o. MRN: [id number]    Subjective:   Chief Complaint  Patient presents with   Annual Exam   Subjective  HPI EMILIANNA BARLOWE presents for office visit today for comprehensive physical exam today and follow up on management of chronic concerns. She reports that she started experiencing midback pain that started 3-4 weeks ago. The pain radiates to her lower back. She did not have any recent falls and her other history of falls was 1 in 2019. Sitting down makes the pain worse, but laying down, stand, or walking alleviates some of the pressure. The pain does not radiate to her bilateral legs and has no trouble with incontinence. Denies CP/palp/SOB/HA/congestion/fevers/GI or GU c/o. Taking meds as prescribed. She reports FMHx of arthritis in grandmother and mother.  Her back problems also extend to her left shoulder and it started after she gave birth to her 41 y/o boy. She experiences numbness and tension in her left shoulder.  She has been dealing with left knee pain and right knee less pain with weakness. She feels that she does not have enough strength when walking downstairs. She experiences the occasional crepitus.     Review of Systems  Constitutional:  Negative for chills, fatigue and fever.  HENT:  Negative for congestion, rhinorrhea, sinus pressure, sinus pain and sore throat.   Eyes:  Negative for pain.  Respiratory:  Negative for cough and shortness of breath.   Cardiovascular:  Negative for chest pain, palpitations and leg swelling.  Gastrointestinal:  Negative for abdominal pain, blood in stool, diarrhea, nausea and vomiting.  Genitourinary:  Negative for decreased urine volume, flank pain, frequency, vaginal bleeding and vaginal discharge.  Musculoskeletal:  Positive for arthralgias and back pain.  Neurological:  Positive for weakness (right knee) and numbness (left shoulder). Negative for headaches.   History Past  Medical History:  Diagnosis Date   Allergy    Anemia    Cervical radiculitis    Heart murmur    Hx of typhoid fever    Hyperglycemia 04/15/2016   Hyperlipidemia, mild 04/15/2016   Left shoulder pain 04/15/2016   Lightheadedness 11/04/2016   Lumbar spine pain    Tachycardia 11/04/2016   Thoracic spine pain     She has a past surgical history that includes Hernia repair and Cesarean section.   Her family history includes Arthritis in her father, maternal grandmother, and paternal grandmother; Cancer (age of onset: 62) in her mother; Cerebrovascular Accident in her paternal grandfather and paternal grandmother; Diabetes in her father, maternal grandfather, and paternal grandmother; Hyperlipidemia in her father; Hypertension in her father, maternal grandfather, maternal grandmother, mother, paternal grandfather, and paternal grandmother; Osteoarthritis in her mother; Stroke in her maternal grandfather.She reports that she has never smoked. She has never used smokeless tobacco. She reports that she does not drink alcohol and does not use drugs.  Current Outpatient Medications on File Prior to Visit  Medication Sig Dispense Refill   KURVELO 0.15-30 MG-MCG tablet Take 1 tablet by mouth daily.     ferrous fumarate (HEMOCYTE - 106 MG FE) 325 (106 Fe) MG TABS tablet Take 1 tablet (106 mg of iron total) by mouth daily. (Patient not taking: Reported on 02/02/2021) 30 tablet 3   Vitamin D, Ergocalciferol, (DRISDOL) 1.25 MG (50000 UNIT) CAPS capsule TAKE 1 CAPSULE BY MOUTH EVERY 7 DAYS (Patient not taking: Reported on 02/02/2021) 4 capsule 0   No current facility-administered medications  on file prior to visit.     Objective:  Objective  Physical Exam Constitutional:      General: She is not in acute distress.    Appearance: Normal appearance. She is not ill-appearing or toxic-appearing.  HENT:     Head: Normocephalic and atraumatic.     Right Ear: Tympanic membrane, ear canal and external ear  normal.     Left Ear: Tympanic membrane, ear canal and external ear normal.     Nose: No congestion or rhinorrhea.     Mouth/Throat:     Mouth: Mucous membranes are moist. No lacerations.     Tongue: No lesions. Tongue does not deviate from midline.  Eyes:     Extraocular Movements: Extraocular movements intact.     Right eye: No nystagmus.     Left eye: No nystagmus.     Pupils: Pupils are equal, round, and reactive to light.  Cardiovascular:     Rate and Rhythm: Normal rate and regular rhythm.     Pulses: Normal pulses.     Heart sounds: Normal heart sounds. No murmur heard. Pulmonary:     Effort: Pulmonary effort is normal. No respiratory distress.     Breath sounds: Normal breath sounds. No wheezing, rhonchi or rales.  Abdominal:     General: Bowel sounds are normal.     Palpations: Abdomen is soft. There is no mass.     Tenderness: There is no abdominal tenderness. There is no guarding.     Hernia: No hernia is present.  Musculoskeletal:        General: Normal range of motion.     Cervical back: Normal range of motion and neck supple.  Skin:    General: Skin is warm and dry.  Neurological:     Mental Status: She is alert and oriented to person, place, and time.     Cranial Nerves: No facial asymmetry.     Motor: Motor function is intact. No weakness.     Deep Tendon Reflexes:     Reflex Scores:      Patellar reflexes are 2+ on the right side and 2+ on the left side. Psychiatric:        Behavior: Behavior normal.   BP 100/64   Pulse 81   Temp 98.3 F (36.8 C)   Resp 16   Ht 5' 3.5" (1.613 m)   Wt 128 lb 12.8 oz (58.4 kg)   SpO2 99%   BMI 22.46 kg/m  Wt Readings from Last 3 Encounters:  04/23/21 128 lb 12.8 oz (58.4 kg)  05/28/19 121 lb (54.9 kg)  04/18/19 121 lb 9.6 oz (55.2 kg)     Lab Results  Component Value Date   WBC 5.0 02/03/2021   HGB 12.1 02/03/2021   HCT 37.9 02/03/2021   PLT 254.0 02/03/2021   GLUCOSE 97 02/03/2021   CHOL 230 (H) 02/03/2021    TRIG 118.0 02/03/2021   HDL 47.50 02/03/2021   LDLCALC 159 (H) 02/03/2021   ALT 13 02/03/2021   AST 14 02/03/2021   NA 138 02/03/2021   K 3.9 02/03/2021   CL 104 02/03/2021   CREATININE 0.76 02/03/2021   BUN 12 02/03/2021   CO2 27 02/03/2021   TSH 1.51 02/03/2021   HGBA1C 5.6 02/03/2021    US Abdomen Limited RUQ  Result Date: 04/18/2019 CLINICAL DATA:  Supraumbilical and right upper quadrant pain EXAM: ULTRASOUND ABDOMEN LIMITED RIGHT UPPER QUADRANT COMPARISON:  None. FINDINGS: Gallbladder: No gallstones or wall thickening  visualized. Sonographic Eulah Pont sign is reportedly negative by the sonographer though patient was pre-medicated prior to this examination limiting the diagnostic utility of this finding. Common bile duct: Diameter: 3 mm, nondilated Liver: No focal lesion identified. Within normal limits in parenchymal echogenicity. Portal vein is patent on color Doppler imaging with normal direction of blood flow towards the liver. Other: None. IMPRESSION: Normal appearance of the gallbladder. Negative sonographic Eulah Pont sign is of limited diagnostic utility in the setting of premedication. Right upper quadrant ultrasound is otherwise unremarkable. Electronically Signed   By: Kreg Shropshire M.D.   On: 04/18/2019 20:36     Assessment & Plan:  Plan    No orders of the defined types were placed in this encounter.   Problem List Items Addressed This Visit     Anxiety state    Still struggling with the grief of loosing her mother in December 2021 she declines any medical changes. She feels she is managing well enough      Left shoulder pain    For years, unchanged. Encouraged moist heat and gentle stretching as tolerated. May try NSAIDs and prescription meds as directed and report if symptoms worsen or seek immediate care      Hyperlipidemia, mild    Encourage heart healthy diet such as MIND or DASH diet, increase exercise, avoid trans fats, simple carbohydrates and processed  foods, consider a krill or fish or flaxseed oil cap daily.       Relevant Orders   CBC with Differential/Platelet   Comprehensive metabolic panel   Lipid panel   TSH   Hyperglycemia    hgba1c acceptable, minimize simple carbs. Increase exercise as tolerated      Relevant Orders   Hemoglobin A1c   Mid back pain    Mid to low back pain. She has had trouble here off and on since her last epidural 13 years ago. For past month pressure and numbness have been worse. Worse when she sits. Check xrays and referred to ortho for evaluation      Preventative health care    Patient encouraged to maintain heart healthy diet, regular exercise, adequate sleep. Consider daily probiotics. Take medications as prescribed. Labs ordered and reviewed. Follows with [facility], reports she is up todate      Relevant Orders   CBC with Differential/Platelet   Comprehensive metabolic panel   Lipid panel   TSH   Iron, TIBC and Ferritin Panel   Vitamin D 1,25 dihydroxy   Anemia    Increase leafy greens, consider increased lean red meat and using cast iron cookware. Continue to monitor, report any concerns      Relevant Orders   TSH   Iron, TIBC and Ferritin Panel   Vitamin D deficiency - Primary    Supplement and monitor      Relevant Orders   TSH   Vitamin D 1,25 dihydroxy   Bilateral knee pain    She notes more weakness in right knee and pain in left knee. No recent injury, redness or warmth. Referred to ortho for further consideration      Relevant Orders   Ambulatory referral to Orthopedic Surgery   DG Knee Complete 4 Views Right   DG Knee Complete 4 Views Left   Other Visit Diagnoses     Need for influenza vaccination       Relevant Orders   Flu Vaccine QUAD 36+ mos IM (Fluarix, Fluzone & Afluria Quad PF (Completed)   Encounter for  hepatitis C screening test for low risk patient       Relevant Orders   Hepatitis C antibody   Back pain, unspecified back location, unspecified  back pain laterality, unspecified chronicity       Relevant Orders   Ambulatory referral to Orthopedic Surgery   DG Thoracic Spine 2 View   DG Lumbar Spine 2-3 Views   Influenza vaccine administered           Follow-up: Return in about 6 months (around 10/22/2021) for f/u vv or in person.  I, Billie Lade, acting as a scribe for Danise Edge, MD, have documented all relevent documentation on behalf of Danise Edge, MD, as directed by Danise Edge, MD while in the presence of Danise Edge, MD. DO:04/23/21.  I, Bradd Canary, MD personally performed the services described in this documentation. All medical record entries made by the scribe were at my direction and in my presence. I have reviewed the chart and agree that the record reflects my personal performance and is accurate and complete

## 2021-04-23 NOTE — Assessment & Plan Note (Signed)
Supplement and monitor 

## 2021-04-27 LAB — IRON,TIBC AND FERRITIN PANEL
%SAT: 8 % (calc) — ABNORMAL LOW (ref 16–45)
Ferritin: 3 ng/mL — ABNORMAL LOW (ref 16–232)
Iron: 40 ug/dL (ref 40–190)
TIBC: 508 mcg/dL (calc) — ABNORMAL HIGH (ref 250–450)

## 2021-04-27 LAB — VITAMIN D 1,25 DIHYDROXY
Vitamin D 1, 25 (OH)2 Total: 58 pg/mL (ref 18–72)
Vitamin D2 1, 25 (OH)2: 23 pg/mL
Vitamin D3 1, 25 (OH)2: 35 pg/mL

## 2021-04-27 LAB — HEPATITIS C ANTIBODY
Hepatitis C Ab: NONREACTIVE
SIGNAL TO CUT-OFF: 0.01 (ref <1.00)

## 2021-05-04 ENCOUNTER — Ambulatory Visit: Payer: No Typology Code available for payment source

## 2021-05-14 ENCOUNTER — Ambulatory Visit: Payer: No Typology Code available for payment source | Admitting: Orthopaedic Surgery

## 2021-05-19 ENCOUNTER — Ambulatory Visit: Payer: No Typology Code available for payment source | Admitting: Orthopaedic Surgery

## 2021-06-04 ENCOUNTER — Ambulatory Visit: Payer: No Typology Code available for payment source | Admitting: Orthopaedic Surgery

## 2021-06-25 ENCOUNTER — Ambulatory Visit: Payer: No Typology Code available for payment source | Admitting: Orthopaedic Surgery

## 2021-07-07 ENCOUNTER — Ambulatory Visit: Payer: No Typology Code available for payment source | Admitting: Orthopaedic Surgery

## 2021-08-04 ENCOUNTER — Ambulatory Visit: Payer: No Typology Code available for payment source | Admitting: Orthopaedic Surgery

## 2021-09-01 ENCOUNTER — Other Ambulatory Visit: Payer: Self-pay | Admitting: Obstetrics and Gynecology

## 2021-09-01 DIAGNOSIS — R928 Other abnormal and inconclusive findings on diagnostic imaging of breast: Secondary | ICD-10-CM

## 2021-09-25 ENCOUNTER — Other Ambulatory Visit: Payer: Self-pay

## 2021-09-25 ENCOUNTER — Ambulatory Visit: Payer: Self-pay

## 2021-09-25 ENCOUNTER — Ambulatory Visit
Admission: RE | Admit: 2021-09-25 | Discharge: 2021-09-25 | Disposition: A | Discharge status: Home / Self Care | Payer: No Typology Code available for payment source | Source: Ambulatory Visit | Attending: Obstetrics and Gynecology | Admitting: Obstetrics and Gynecology

## 2021-09-25 DIAGNOSIS — R928 Other abnormal and inconclusive findings on diagnostic imaging of breast: Secondary | ICD-10-CM

## 2021-10-22 ENCOUNTER — Ambulatory Visit: Payer: No Typology Code available for payment source | Admitting: Family Medicine

## 2021-11-27 ENCOUNTER — Telehealth: Payer: Self-pay | Admitting: Family Medicine

## 2021-11-27 ENCOUNTER — Encounter: Payer: Self-pay | Admitting: Family Medicine

## 2021-11-27 DIAGNOSIS — E785 Hyperlipidemia, unspecified: Secondary | ICD-10-CM

## 2021-11-27 DIAGNOSIS — E559 Vitamin D deficiency, unspecified: Secondary | ICD-10-CM

## 2021-11-27 DIAGNOSIS — R739 Hyperglycemia, unspecified: Secondary | ICD-10-CM

## 2021-11-27 DIAGNOSIS — D508 Other iron deficiency anemias: Secondary | ICD-10-CM

## 2021-11-27 NOTE — Telephone Encounter (Signed)
Orders have been placed- do you remember if she wanted to do them now or before her visit in August?  ? ? ?

## 2021-11-27 NOTE — Telephone Encounter (Signed)
She wants to come about a week before appt! I have her schedule on 14th ?

## 2021-11-27 NOTE — Telephone Encounter (Signed)
Patient has appt for follow up on labs ?She would like to do labs before coming in ?Can we insert? ?iron and Vitamin D ?

## 2021-11-27 NOTE — Telephone Encounter (Signed)
Pt requesting orders for labs prior to her appt. Please advise.  ?

## 2022-01-28 ENCOUNTER — Ambulatory Visit: Payer: No Typology Code available for payment source | Admitting: Family Medicine

## 2022-03-01 ENCOUNTER — Other Ambulatory Visit: Payer: No Typology Code available for payment source

## 2022-03-04 ENCOUNTER — Other Ambulatory Visit (INDEPENDENT_AMBULATORY_CARE_PROVIDER_SITE_OTHER): Payer: No Typology Code available for payment source

## 2022-03-04 DIAGNOSIS — E785 Hyperlipidemia, unspecified: Secondary | ICD-10-CM | POA: Diagnosis not present

## 2022-03-04 DIAGNOSIS — D508 Other iron deficiency anemias: Secondary | ICD-10-CM | POA: Diagnosis not present

## 2022-03-04 DIAGNOSIS — E559 Vitamin D deficiency, unspecified: Secondary | ICD-10-CM

## 2022-03-04 DIAGNOSIS — R739 Hyperglycemia, unspecified: Secondary | ICD-10-CM | POA: Diagnosis not present

## 2022-03-04 LAB — LIPID PANEL
Cholesterol: 241 mg/dL — ABNORMAL HIGH (ref 0–200)
HDL: 38.7 mg/dL — ABNORMAL LOW (ref >39.00)
LDL Cholesterol: 180 mg/dL — ABNORMAL HIGH (ref 0–99)
NonHDL: 202.37
Total CHOL/HDL Ratio: 6
Triglycerides: 113 mg/dL (ref 0.0–149.0)
VLDL: 22.6 mg/dL (ref 0.0–40.0)

## 2022-03-04 LAB — HEMOGLOBIN A1C: Hgb A1c MFr Bld: 5.9 % (ref 4.6–6.5)

## 2022-03-04 LAB — TSH: TSH: 1.5 u[IU]/mL (ref 0.35–5.50)

## 2022-03-04 LAB — COMPREHENSIVE METABOLIC PANEL
ALT: 11 U/L (ref 0–35)
AST: 13 U/L (ref 0–37)
Albumin: 4.1 g/dL (ref 3.5–5.2)
Alkaline Phosphatase: 73 U/L (ref 39–117)
BUN: 12 mg/dL (ref 6–23)
CO2: 27 mEq/L (ref 19–32)
Calcium: 9.1 mg/dL (ref 8.4–10.5)
Chloride: 103 mEq/L (ref 96–112)
Creatinine, Ser: 0.78 mg/dL (ref 0.40–1.20)
GFR: 93.63 mL/min (ref >60.00)
Glucose, Bld: 89 mg/dL (ref 70–99)
Potassium: 4.2 mEq/L (ref 3.5–5.1)
Sodium: 137 mEq/L (ref 135–145)
Total Bilirubin: 0.3 mg/dL (ref 0.2–1.2)
Total Protein: 7.3 g/dL (ref 6.0–8.3)

## 2022-03-04 LAB — CBC WITH DIFFERENTIAL/PLATELET
Basophils Absolute: 0 10*3/uL (ref 0.0–0.1)
Basophils Relative: 0.4 % (ref 0.0–3.0)
Eosinophils Absolute: 0.1 10*3/uL (ref 0.0–0.7)
Eosinophils Relative: 2.1 % (ref 0.0–5.0)
HCT: 36.6 % (ref 36.0–46.0)
Hemoglobin: 11.7 g/dL — ABNORMAL LOW (ref 12.0–15.0)
Lymphocytes Relative: 31.4 % (ref 12.0–46.0)
Lymphs Abs: 1.3 10*3/uL (ref 0.7–4.0)
MCHC: 32 g/dL (ref 30.0–36.0)
MCV: 78.9 fl (ref 78.0–100.0)
Monocytes Absolute: 0.3 10*3/uL (ref 0.1–1.0)
Monocytes Relative: 7.2 % (ref 3.0–12.0)
Neutro Abs: 2.5 10*3/uL (ref 1.4–7.7)
Neutrophils Relative %: 58.9 % (ref 43.0–77.0)
Platelets: 289 10*3/uL (ref 150.0–400.0)
RBC: 4.64 Mil/uL (ref 3.87–5.11)
RDW: 14.7 % (ref 11.5–15.5)
WBC: 4.2 10*3/uL (ref 4.0–10.5)

## 2022-03-04 LAB — IBC + FERRITIN
Ferritin: 2.5 ng/mL — ABNORMAL LOW (ref 10.0–291.0)
Iron: 33 ug/dL — ABNORMAL LOW (ref 42–145)
Saturation Ratios: 5.3 % — ABNORMAL LOW (ref 20.0–50.0)
TIBC: 620.2 ug/dL — ABNORMAL HIGH (ref 250.0–450.0)
Transferrin: 443 mg/dL — ABNORMAL HIGH (ref 212.0–360.0)

## 2022-03-08 ENCOUNTER — Encounter: Payer: Self-pay | Admitting: Family Medicine

## 2022-03-08 ENCOUNTER — Ambulatory Visit (INDEPENDENT_AMBULATORY_CARE_PROVIDER_SITE_OTHER): Payer: No Typology Code available for payment source | Admitting: Family Medicine

## 2022-03-08 VITALS — BP 133/86 | HR 85 | Temp 97.6°F | Resp 16 | Ht 63.5 in | Wt 130.1 lb

## 2022-03-08 DIAGNOSIS — R739 Hyperglycemia, unspecified: Secondary | ICD-10-CM | POA: Diagnosis not present

## 2022-03-08 DIAGNOSIS — E785 Hyperlipidemia, unspecified: Secondary | ICD-10-CM | POA: Diagnosis not present

## 2022-03-08 DIAGNOSIS — E559 Vitamin D deficiency, unspecified: Secondary | ICD-10-CM | POA: Diagnosis not present

## 2022-03-08 DIAGNOSIS — G6289 Other specified polyneuropathies: Secondary | ICD-10-CM | POA: Diagnosis not present

## 2022-03-08 DIAGNOSIS — D508 Other iron deficiency anemias: Secondary | ICD-10-CM

## 2022-03-08 DIAGNOSIS — F411 Generalized anxiety disorder: Secondary | ICD-10-CM

## 2022-03-08 LAB — VITAMIN D 1,25 DIHYDROXY
Vitamin D 1, 25 (OH)2 Total: 54 pg/mL (ref 18–72)
Vitamin D2 1, 25 (OH)2: 12 pg/mL
Vitamin D3 1, 25 (OH)2: 42 pg/mL

## 2022-03-08 NOTE — Patient Instructions (Addendum)
Yerba Matte tea do not drink  Heel Pad Atrophy  Heel pad atrophy is a cause of heel pain. Your heels take much of the impact when you stand, walk, or run. A fat pad under your heel bone protects the bone and cushions it. Over time, the fat pad can break down and lose elasticity and size (atrophy). This causes heel pain due to decreased cushioning in your heel. In many cases, the pain occurs in both heels. You may feel an aching or burning pain that gets worse while you are walking or standing. This pain may be worse for athletes who play sports on hard surfaces with high impact on their heels. What are the causes? This condition is caused by the gradual break down or atrophy of the fat pad under the heel bone. What increases the risk? This condition is more likely to develop in people who: Are age 45 or older. Play a sport that involves jumping and landing on hard surfaces, such as in basketball. Are runners, especially if they land on their heels first when they run. Are overweight. Have diabetes, rheumatoid arthritis, or blood vessel disease. Get steroid injections in the heel area. Have had trauma to the heel area, such as falling from a height. What are the signs or symptoms? The most common symptom of this condition is burning or aching pain in one or both heels. The pain or tenderness may be worse when: Walking or standing, especially on hard surfaces or for long amounts of time. Walking barefoot. Wearing hard-soled shoes. Resting at night. Pressing on the center of the heel. How is this diagnosed? This condition is diagnosed based on your symptoms, medical history, and a physical exam. Your health care provider will check your heel pad and see if you have tenderness in the center of your heel. You may also have an ultrasound or an MRI study to confirm the diagnosis and measure the thickness of your fat pad. How is this treated? Treatment for this condition includes: Lessening the  amount of time spent walking, standing, or running. Avoiding activities that cause pain. Taking an over-the-counter pain reliever or anti-inflammatory medicine. Wearing shoes with thick heel cushioning or using a soft shoe insert (orthotic). Wearing shoes at all times, even indoors. Taping your heels to increase support. Losing weight if you are overweight. Follow these instructions at home: Managing pain, stiffness, and swelling  If directed, put ice on the painful area. To do this: Put ice in a plastic bag. Place a towel between your heel and the bag. Leave the ice on for 20 minutes, 2-3 times a day. Remove the ice if your skin turns bright red. This is very important. If you cannot feel pain, heat, or cold, you have a greater risk of damage to the area. Move your toes often to reduce stiffness and swelling. Raise (elevate) your foot above the level of your heart while you are sitting or lying down. Activity Return to your normal activities as told by your health care provider. Ask your health care provider what activities are safe for you. Do exercises as told by your health care provider. General instructions Take over-the-counter and prescription medicines only as told by your health care provider. Wear orthotics and supportive shoes as told by your health care provider. Do not wear high heels. Do not use any products that contain nicotine or tobacco. These products include cigarettes, chewing tobacco, and vaping devices, such as e-cigarettes. If you need help quitting, ask your  health care provider. If you are overweight, work with your health care provider to reach a healthy weight. Keep all follow-up visits. This is important. How is this prevented? Give your body time to rest between periods of activity. Wear comfortable and supportive shoes during athletic activity. Maintain a healthy weight. Manage long-term (chronic) health conditions, such as diabetes, high blood pressure,  or blood vessel disease. Contact a health care provider if: Your symptoms do not improve or they get worse. You have concerns about your orthotics and shoes. Summary Heel pad atrophy is a cause of heel pain. This condition is caused by the gradual breakdown of the fat pad under the heel bone. Treatment may include avoiding activities that cause pain, using an orthotic, and maintaining a healthy weight. This information is not intended to replace advice given to you by your health care provider. Make sure you discuss any questions you have with your health care provider. Document Revised: 02/16/2021 Document Reviewed: 02/16/2021 Elsevier Patient Education  2023 [employer].

## 2022-03-08 NOTE — Progress Notes (Signed)
Subjective:    Patient ID: Tara Stevenson, female    DOB: Oct 26, 1980, 41 y.o.   MRN: [id number]  Chief Complaint  Patient presents with   Follow-up    HPI Patient is in today for follow up on chronic medical concerns. No recent febrile illness or acute hospitalizations. No complaints of polyuria or poldipsia. She is staying active but she acknowledges she does not always eat a heart healthy diet. Their family has been under a great deal of stress as their 42 yo nephew has terminal colon cancer. Denies CP/palp/SOB/HA/congestion/fevers/GI or GU c/o. Taking meds as prescribed   Past Medical History:  Diagnosis Date   Allergy    Anemia    Cervical radiculitis    Heart murmur    Hx of typhoid fever    Hyperglycemia 04/15/2016   Hyperlipidemia, mild 04/15/2016   Left shoulder pain 04/15/2016   Lightheadedness 11/04/2016   Lumbar spine pain    Tachycardia 11/04/2016   Thoracic spine pain     Past Surgical History:  Procedure Laterality Date   CESAREAN SECTION     2005   HERNIA REPAIR     2007    Family History  Problem Relation Age of Onset   Cancer Mother 69       uterine cancer with ovarian cyst   Hypertension Mother    Osteoarthritis Mother    Arthritis Father    Hypertension Father    Hyperlipidemia Father    Diabetes Father    Hypertension Maternal Grandmother    Arthritis Maternal Grandmother    Diabetes Maternal Grandfather    Stroke Maternal Grandfather    Hypertension Maternal Grandfather    Arthritis Paternal Grandmother    Hypertension Paternal Grandmother    Diabetes Paternal Grandmother    Cerebrovascular Accident Paternal Grandmother    Hypertension Paternal Grandfather    Cerebrovascular Accident Paternal Grandfather    Breast cancer Neg Hx     Social History   Socioeconomic History   Marital status: Married    Spouse name: Not on file   Number of children: 2   Years of education: BS degree   Highest education level: Not on file  Occupational  History   Occupation: Self employeed  Tobacco Use   Smoking status: Never   Smokeless tobacco: Never  Substance and Sexual Activity   Alcohol use: No    Alcohol/week: 0.0 standard drinks of alcohol   Drug use: No   Sexual activity: Yes    Birth control/protection: None  Other Topics Concern   Not on file  Social History Narrative   Pt married / 2 children (sons 2005 and 2007)   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    Pt has bachelors in Careers information officer   No smoking    No alcohol   No drug use   Cup of tea a day    Ronie Spies   Enjoys television, drawing,  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     Ferrous Sulfate (IRON PO) Take  by mouth.     KURVELO 0.15-30 MG-MCG tablet Take 1 tablet by mouth daily.     ferrous fumarate (HEMOCYTE - 106 MG FE) 325 (106 Fe) MG TABS tablet Take 1 tablet (106 mg of iron total) by mouth daily. (Patient not taking: Reported on 02/02/2021) 30 tablet 3   Vitamin D, Ergocalciferol, (DRISDOL) 1.25 MG (50000 UNIT) CAPS capsule TAKE 1 CAPSULE BY MOUTH EVERY 7 DAYS (Patient not taking: Reported on 02/02/2021) 4 capsule 0   No facility-administered medications prior to visit.    Allergies  Allergen Reactions   Flonase [Fluticasone Propionate]     Anxious, restless    Review of Systems  Constitutional:  Negative for fever and malaise/fatigue.  HENT:  Negative for congestion.   Eyes:  Negative for blurred vision.  Respiratory:  Negative for shortness of breath.   Cardiovascular:  Negative for chest pain, palpitations and leg swelling.   Gastrointestinal:  Negative for abdominal pain, blood in stool and nausea.  Genitourinary:  Negative for dysuria and frequency.  Musculoskeletal:  Negative for falls.  Skin:  Negative for rash.  Neurological:  Negative for dizziness, loss of consciousness and headaches.  Endo/Heme/Allergies:  Negative for environmental allergies.  Psychiatric/Behavioral:  Negative for depression. The patient is not nervous/anxious.        Objective:    Physical Exam Constitutional:      General: She is not in acute distress.    Appearance: She is well-developed.  HENT:     Head: Normocephalic and atraumatic.  Eyes:     Conjunctiva/sclera: Conjunctivae normal.  Neck:     Thyroid: No thyromegaly.  Cardiovascular:     Rate and Rhythm: Normal rate and regular rhythm.     Heart sounds: Normal heart sounds. No murmur heard. Pulmonary:     Effort: Pulmonary effort is normal. No respiratory distress.     Breath sounds: Normal breath sounds.  Abdominal:     General: Bowel sounds are normal. There is no distension.     Palpations: Abdomen is soft. There is no mass.     Tenderness: There is no abdominal tenderness.  Musculoskeletal:     Cervical back: Neck supple.  Lymphadenopathy:     Cervical: No cervical adenopathy.  Skin:    General: Skin is warm and dry.  Neurological:     Mental Status: She is alert and oriented to person, place, and time.  Psychiatric:        Behavior: Behavior normal.     BP 133/86   Pulse 85   Temp 97.6 F (36.4 C) (Oral)   Resp 16   Ht 5' 3.5" (1.613 m)   Wt 130 lb 2 oz (59 kg)   SpO2 100%   BMI 22.69 kg/m  Wt Readings from Last 3 Encounters:  03/08/22 130 lb 2 oz (59 kg)  04/23/21 128 lb 12.8 oz (58.4 kg)  05/28/19 121 lb (54.9 kg)    Diabetic Foot Exam - Simple   No data filed    Lab Results  Component Value Date   WBC 4.2 03/04/2022   HGB 11.7 (L) 03/04/2022   HCT 36.6 03/04/2022   PLT 289.0 03/04/2022   GLUCOSE 89 03/04/2022   CHOL 241 (H)  03/04/2022   TRIG 113.0 03/04/2022   HDL 38.70 (L) 03/04/2022   LDLCALC 180 (H) 03/04/2022   ALT 11 03/04/2022   AST 13 03/04/2022   NA 137 03/04/2022   K 4.2 03/04/2022   CL 103 03/04/2022  CREATININE 0.78 03/04/2022   BUN 12 03/04/2022   CO2 27 03/04/2022   TSH 1.50 03/04/2022   HGBA1C 5.9 03/04/2022    Lab Results  Component Value Date   TSH 1.50 03/04/2022   Lab Results  Component Value Date   WBC 4.2 03/04/2022   HGB 11.7 (L) 03/04/2022   HCT 36.6 03/04/2022   MCV 78.9 03/04/2022   PLT 289.0 03/04/2022   Lab Results  Component Value Date   NA 137 03/04/2022   K 4.2 03/04/2022   CO2 27 03/04/2022   GLUCOSE 89 03/04/2022   BUN 12 03/04/2022   CREATININE 0.78 03/04/2022   BILITOT 0.3 03/04/2022   ALKPHOS 73 03/04/2022   AST 13 03/04/2022   ALT 11 03/04/2022   PROT 7.3 03/04/2022   ALBUMIN 4.1 03/04/2022   CALCIUM 9.1 03/04/2022   ANIONGAP 9 04/18/2019   GFR 93.63 03/04/2022   Lab Results  Component Value Date   CHOL 241 (H) 03/04/2022   Lab Results  Component Value Date   HDL 38.70 (L) 03/04/2022   Lab Results  Component Value Date   LDLCALC 180 (H) 03/04/2022   Lab Results  Component Value Date   TRIG 113.0 03/04/2022   Lab Results  Component Value Date   CHOLHDL 6 03/04/2022   Lab Results  Component Value Date   HGBA1C 5.9 03/04/2022       Assessment & Plan:   Problem List Items Addressed This Visit     Anxiety state    Lost her mother in 2021 and has struggled some since that time but overall is managing well despite the terminal diagnosis of colon cancer in her 29 year old nephew      Hyperlipidemia, mild    Encourage heart healthy diet such as MIND or DASH diet, increase exercise, avoid trans fats, simple carbohydrates and processed foods, consider a krill or fish or flaxseed oil cap daily.       Relevant Orders   Lipid panel   TSH   Hyperglycemia    hgba1c acceptable, minimize simple carbs. Increase exercise as  tolerated.      Relevant Orders   Comprehensive metabolic panel   TSH   Hemoglobin A1c   Anemia    Mild, recurrent. Increase leafy greens, consider increased lean red meat and using cast iron cookware. Continue to monitor, report any concerns      Relevant Medications   Ferrous Sulfate (IRON PO)   Other Relevant Orders   CBC   Vitamin D deficiency    Supplement and monitor      Relevant Orders   VITAMIN D 25 Hydroxy (Vit-D Deficiency, Fractures)   Other Visit Diagnoses     Other polyneuropathy    -  Primary   Relevant Orders   Vitamin B12       I am having Ilynn M. Kozlov maintain her ferrous fumarate, Kurvelo, Vitamin D (Ergocalciferol), Vitamin D, and Ferrous Sulfate (IRON PO).  No orders of the defined types were placed in this encounter.    Danise Edge, MD

## 2022-03-08 NOTE — Assessment & Plan Note (Signed)
hgba1c acceptable, minimize simple carbs. Increase exercise as tolerated.  

## 2022-03-08 NOTE — Assessment & Plan Note (Signed)
Supplement and monitor 

## 2022-03-08 NOTE — Assessment & Plan Note (Addendum)
Lost her mother in 2021 and has struggled some since that time but overall is managing well despite the terminal diagnosis of colon cancer in her 42 year old nephew

## 2022-03-08 NOTE — Assessment & Plan Note (Signed)
Encourage heart healthy diet such as MIND or DASH diet, increase exercise, avoid trans fats, simple carbohydrates and processed foods, consider a krill or fish or flaxseed oil cap daily.  °

## 2022-03-08 NOTE — Assessment & Plan Note (Signed)
Mild, recurrent. Increase leafy greens, consider increased lean red meat and using cast iron cookware. Continue to monitor, report any concerns 

## 2022-09-06 ENCOUNTER — Other Ambulatory Visit (INDEPENDENT_AMBULATORY_CARE_PROVIDER_SITE_OTHER): Payer: No Typology Code available for payment source

## 2022-09-06 DIAGNOSIS — E785 Hyperlipidemia, unspecified: Secondary | ICD-10-CM

## 2022-09-06 DIAGNOSIS — E559 Vitamin D deficiency, unspecified: Secondary | ICD-10-CM

## 2022-09-06 DIAGNOSIS — G6289 Other specified polyneuropathies: Secondary | ICD-10-CM

## 2022-09-06 DIAGNOSIS — D508 Other iron deficiency anemias: Secondary | ICD-10-CM | POA: Diagnosis not present

## 2022-09-06 DIAGNOSIS — R739 Hyperglycemia, unspecified: Secondary | ICD-10-CM

## 2022-09-06 LAB — COMPREHENSIVE METABOLIC PANEL
ALT: 18 U/L (ref 0–35)
AST: 17 U/L (ref 0–37)
Albumin: 4.2 g/dL (ref 3.5–5.2)
Alkaline Phosphatase: 73 U/L (ref 39–117)
BUN: 14 mg/dL (ref 6–23)
CO2: 26 mEq/L (ref 19–32)
Calcium: 9.2 mg/dL (ref 8.4–10.5)
Chloride: 106 mEq/L (ref 96–112)
Creatinine, Ser: 0.81 mg/dL (ref 0.40–1.20)
GFR: 89.17 mL/min (ref >60.00)
Glucose, Bld: 102 mg/dL — ABNORMAL HIGH (ref 70–99)
Potassium: 4 mEq/L (ref 3.5–5.1)
Sodium: 140 mEq/L (ref 135–145)
Total Bilirubin: 0.3 mg/dL (ref 0.2–1.2)
Total Protein: 7.4 g/dL (ref 6.0–8.3)

## 2022-09-06 LAB — VITAMIN D 25 HYDROXY (VIT D DEFICIENCY, FRACTURES): VITD: 21.64 ng/mL — ABNORMAL LOW (ref 30.00–100.00)

## 2022-09-06 LAB — LIPID PANEL
Cholesterol: 236 mg/dL — ABNORMAL HIGH (ref 0–200)
HDL: 43.9 mg/dL (ref >39.00)
LDL Cholesterol: 168 mg/dL — ABNORMAL HIGH (ref 0–99)
NonHDL: 191.72
Total CHOL/HDL Ratio: 5
Triglycerides: 120 mg/dL (ref 0.0–149.0)
VLDL: 24 mg/dL (ref 0.0–40.0)

## 2022-09-06 LAB — CBC
HCT: 40.4 % (ref 36.0–46.0)
Hemoglobin: 13.1 g/dL (ref 12.0–15.0)
MCHC: 32.4 g/dL (ref 30.0–36.0)
MCV: 81.4 fl (ref 78.0–100.0)
Platelets: 263 10*3/uL (ref 150.0–400.0)
RBC: 4.97 Mil/uL (ref 3.87–5.11)
RDW: 16 % — ABNORMAL HIGH (ref 11.5–15.5)
WBC: 5.3 10*3/uL (ref 4.0–10.5)

## 2022-09-06 LAB — TSH: TSH: 1.72 u[IU]/mL (ref 0.35–5.50)

## 2022-09-06 LAB — HEMOGLOBIN A1C: Hgb A1c MFr Bld: 5.7 % (ref 4.6–6.5)

## 2022-09-06 LAB — VITAMIN B12: Vitamin B-12: 268 pg/mL (ref 211–911)

## 2022-09-08 ENCOUNTER — Other Ambulatory Visit: Payer: Self-pay

## 2022-09-08 MED ORDER — VITAMIN D (ERGOCALCIFEROL) 1.25 MG (50000 UNIT) PO CAPS: 50000.0000 [IU] | ORAL_CAPSULE | ORAL | 4 refills | Status: DC

## 2022-09-09 LAB — HM PAP SMEAR
HM Pap smear: NEGATIVE
HPV, high-risk: NEGATIVE

## 2022-09-12 NOTE — Assessment & Plan Note (Signed)
hgba1c acceptable, minimize simple carbs. Increase exercise as tolerated.  

## 2022-09-12 NOTE — Assessment & Plan Note (Signed)
Patient encouraged to maintain heart healthy diet, regular exercise, adequate sleep. Consider daily probiotics. Take medications as prescribed. Labs ordered and reviewed. Follows with [facility], reports she is up todate, request records

## 2022-09-12 NOTE — Assessment & Plan Note (Signed)
Encourage heart healthy diet such as MIND or DASH diet, increase exercise, avoid trans fats, simple carbohydrates and processed foods, consider a krill or fish or flaxseed oil cap daily.  °

## 2022-09-12 NOTE — Assessment & Plan Note (Signed)
Supplement and monitor 

## 2022-09-13 ENCOUNTER — Encounter: Payer: No Typology Code available for payment source | Admitting: Family Medicine

## 2022-09-13 ENCOUNTER — Ambulatory Visit (INDEPENDENT_AMBULATORY_CARE_PROVIDER_SITE_OTHER): Payer: No Typology Code available for payment source | Admitting: Family Medicine

## 2022-09-13 VITALS — BP 124/76 | HR 72 | Temp 97.5°F | Resp 16 | Ht 63.0 in | Wt 133.8 lb

## 2022-09-13 DIAGNOSIS — E559 Vitamin D deficiency, unspecified: Secondary | ICD-10-CM

## 2022-09-13 DIAGNOSIS — E785 Hyperlipidemia, unspecified: Secondary | ICD-10-CM

## 2022-09-13 DIAGNOSIS — D649 Anemia, unspecified: Secondary | ICD-10-CM | POA: Diagnosis not present

## 2022-09-13 DIAGNOSIS — Z0000 Encounter for general adult medical examination without abnormal findings: Secondary | ICD-10-CM

## 2022-09-13 DIAGNOSIS — R739 Hyperglycemia, unspecified: Secondary | ICD-10-CM | POA: Diagnosis not present

## 2022-09-13 NOTE — Patient Instructions (Addendum)
Nutrofol daily Biotin Fish oil  Multivitamin   Metamuci/Psylliuml or Benefiber powder daily Probiotic Pendulum   Preventive Care 10-43 Years Old, Female Preventive care refers to lifestyle choices and visits with your health care provider that can promote health and wellness. Preventive care visits are also called wellness exams. What can I expect for my preventive care visit? Counseling During your preventive care visit, your health care provider may ask about your: Medical history, including: Past medical problems. Family medical history. Pregnancy history. Current health, including: Menstrual cycle. Method of birth control. Emotional well-being. Home life and relationship well-being. Sexual activity and sexual health. Lifestyle, including: Alcohol, nicotine or tobacco, and drug use. Access to firearms. Diet, exercise, and sleep habits. Work and work Statistician. Sunscreen use. Safety issues such as seatbelt and bike helmet use. Physical exam Your health care provider may check your: Height and weight. These may be used to calculate your BMI (body mass index). BMI is a measurement that tells if you are at a healthy weight. Waist circumference. This measures the distance around your waistline. This measurement also tells if you are at a healthy weight and may help predict your risk of certain diseases, such as type 2 diabetes and high blood pressure. Heart rate and blood pressure. Body temperature. Skin for abnormal spots. What immunizations do I need?  Vaccines are usually given at various ages, according to a schedule. Your health care provider will recommend vaccines for you based on your age, medical history, and lifestyle or other factors, such as travel or where you work. What tests do I need? Screening Your health care provider may recommend screening tests for certain conditions. This may include: Pelvic exam and Pap test. Lipid and cholesterol levels. Diabetes  screening. This is done by checking your blood sugar (glucose) after you have not eaten for a while (fasting). Hepatitis B test. Hepatitis C test. HIV (human immunodeficiency virus) test. STI (sexually transmitted infection) testing, if you are at risk. BRCA-related cancer screening. This may be done if you have a family history of breast, ovarian, tubal, or peritoneal cancers. Talk with your health care provider about your test results, treatment options, and if necessary, the need for more tests. Follow these instructions at home: Eating and drinking  Eat a healthy diet that includes fresh fruits and vegetables, whole grains, lean protein, and low-fat dairy products. Take vitamin and mineral supplements as recommended by your health care provider. Do not drink alcohol if: Your health care provider tells you not to drink. You are pregnant, may be pregnant, or are planning to become pregnant. If you drink alcohol: Limit how much you have to 0-1 drink a day. Know how much alcohol is in your drink. In the [place] one drink equals one 12 oz bottle of beer (355 mL), one 5 oz glass of wine (148 mL), or one 1 oz glass of hard liquor (44 mL). Lifestyle Brush your teeth every morning and night with fluoride toothpaste. Floss one time each day. Exercise for at least 30 minutes 5 or more days each week. Do not use any products that contain nicotine or tobacco. These products include cigarettes, chewing tobacco, and vaping devices, such as e-cigarettes. If you need help quitting, ask your health care provider. Do not use drugs. If you are sexually active, practice safe sex. Use a condom or other form of protection to prevent STIs. If you do not wish to become pregnant, use a form of birth control. If you plan to become pregnant,  see your health care provider for a prepregnancy visit. Find healthy ways to manage stress, such as: Meditation, yoga, or listening to music. Journaling. Talking to a trusted  person. Spending time with friends and family. Minimize exposure to UV radiation to reduce your risk of skin cancer. Safety Always wear your seat belt while driving or riding in a vehicle. Do not drive: If you have been drinking alcohol. Do not ride with someone who has been drinking. If you have been using any mind-altering substances or drugs. While texting. When you are tired or distracted. Wear a helmet and other protective equipment during sports activities. If you have firearms in your house, make sure you follow all gun safety procedures. Seek help if you have been physically or sexually abused. What's next? Go to your health care provider once a year for an annual wellness visit. Ask your health care provider how often you should have your eyes and teeth checked. Stay up to date on all vaccines. This information is not intended to replace advice given to you by your health care provider. Make sure you discuss any questions you have with your health care provider. Document Revised: 12/31/2020 Document Reviewed: 12/31/2020 Elsevier Patient Education  [place].

## 2022-09-15 NOTE — Progress Notes (Signed)
Subjective:    Patient ID: Tara Stevenson, female    DOB: January 02, 1981, 42 y.o.   MRN: [id number]  Chief Complaint  Patient presents with   Annual Exam    Annual Exam     HPI Patient is in today for annual preventative exam and follow up on chronic medical concerns. No recent febrile illness or hospitalizations. Denies CP/palp/SOB/HA/congestion/fevers/GI or GU c/o. Taking meds as prescribed. She stays busy caring for her sons. She is trying to maintain a heart healthy diet and stay active. She hydrates well and attempts to get 6-8 hours of sleep nightly.   Past Medical History:  Diagnosis Date   Allergy    Anemia    Cervical radiculitis    Heart murmur    Hx of typhoid fever    Hyperglycemia 04/15/2016   Hyperlipidemia, mild 04/15/2016   Left shoulder pain 04/15/2016   Lightheadedness 11/04/2016   Lumbar spine pain    Tachycardia 11/04/2016   Thoracic spine pain     Past Surgical History:  Procedure Laterality Date   CESAREAN SECTION     2005   HERNIA REPAIR     2007    Family History  Problem Relation Age of Onset   Cancer Mother 50       uterine cancer with ovarian cyst   Hypertension Mother    Osteoarthritis Mother    Arthritis Father    Hypertension Father    Hyperlipidemia Father    Diabetes Father    Hypertension Maternal Grandmother    Arthritis Maternal Grandmother    Diabetes Maternal Grandfather    Stroke Maternal Grandfather    Hypertension Maternal Grandfather    Arthritis Paternal Grandmother    Hypertension Paternal Grandmother    Diabetes Paternal Grandmother    Cerebrovascular Accident Paternal Grandmother    Hypertension Paternal Grandfather    Cerebrovascular Accident Paternal Grandfather    Breast cancer Neg Hx     Social History   Socioeconomic History   Marital status: Married    Spouse name: Not on file   Number of children: 2   Years of education: BS degree   Highest education level: Not on file  Occupational History    Occupation: Self employeed  Tobacco Use   Smoking status: Never   Smokeless tobacco: Never  Substance and Sexual Activity   Alcohol use: No    Alcohol/week: 0.0 standard drinks of alcohol   Drug use: No   Sexual activity: Yes    Birth control/protection: None  Other Topics Concern   Not on file  Social History Narrative   Pt married / 2 children (sons 2005 and 2007)   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    Pt has bachelors in Careers information officer   No smoking    No alcohol   No drug use   Cup of tea a day    Ronie Spies   Enjoys television, drawing,  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     ferrous fumarate (HEMOCYTE - 106  MG FE) 325 (106 Fe) MG TABS tablet Take 1 tablet (106 mg of iron total) by mouth daily. 30 tablet 3   Ferrous Sulfate (IRON PO) Take by mouth.     KURVELO 0.15-30 MG-MCG tablet Take 1 tablet by mouth daily.     Vitamin D, Ergocalciferol, (DRISDOL) 1.25 MG (50000 UNIT) CAPS capsule TAKE 1 CAPSULE BY MOUTH EVERY 7 DAYS 4 capsule 0   Vitamin D, Ergocalciferol, (DRISDOL) 1.25 MG (50000 UNIT) CAPS capsule Take 1 capsule (50,000 Units total) by mouth every 7 (seven) days. 5 capsule 4   No facility-administered medications prior to visit.    Allergies  Allergen Reactions   Flonase [Fluticasone Propionate]     Anxious, restless    Review of Systems  Constitutional:  Negative for chills, fever and malaise/fatigue.  HENT:  Negative for congestion and hearing loss.   Eyes:  Negative for discharge.  Respiratory:  Negative for cough, sputum production and  shortness of breath.   Cardiovascular:  Negative for chest pain, palpitations and leg swelling.  Gastrointestinal:  Negative for abdominal pain, blood in stool, constipation, diarrhea, heartburn, nausea and vomiting.  Genitourinary:  Negative for dysuria, frequency, hematuria and urgency.  Musculoskeletal:  Negative for back pain, falls and myalgias.  Skin:  Negative for rash.  Neurological:  Negative for dizziness, sensory change, loss of consciousness, weakness and headaches.  Endo/Heme/Allergies:  Negative for environmental allergies. Does not bruise/bleed easily.  Psychiatric/Behavioral:  Negative for depression and suicidal ideas. The patient is not nervous/anxious and does not have insomnia.        Objective:    Physical Exam Constitutional:      General: She is not in acute distress.    Appearance: Normal appearance. She is not diaphoretic.  HENT:     Head: Normocephalic and atraumatic.     Right Ear: Tympanic membrane, ear canal and external ear normal.     Left Ear: Tympanic membrane, ear canal and external ear normal.     Nose: Nose normal.     Mouth/Throat:     Mouth: Mucous membranes are moist.     Pharynx: Oropharynx is clear. No oropharyngeal exudate.  Eyes:     General: No scleral icterus.       Right eye: No discharge.        Left eye: No discharge.     Conjunctiva/sclera: Conjunctivae normal.     Pupils: Pupils are equal, round, and reactive to light.  Neck:     Thyroid: No thyromegaly.  Cardiovascular:     Rate and Rhythm: Normal rate and regular rhythm.     Heart sounds: Normal heart sounds. No murmur heard. Pulmonary:     Effort: Pulmonary effort is normal. No respiratory distress.     Breath sounds: Normal breath sounds. No wheezing or rales.  Abdominal:     General: Bowel sounds are normal. There is no distension.     Palpations: Abdomen is soft. There is no mass.     Tenderness: There is no abdominal tenderness.  Musculoskeletal:        General: No  tenderness. Normal range of motion.     Cervical back: Normal range of motion and neck supple.  Lymphadenopathy:     Cervical: No cervical adenopathy.  Skin:    General: Skin is warm and dry.     Findings: No rash.  Neurological:     General: No focal deficit present.     Mental Status: She is alert and oriented  to person, place, and time.     Cranial Nerves: No cranial nerve deficit.     Coordination: Coordination normal.     Deep Tendon Reflexes: Reflexes are normal and symmetric. Reflexes normal.  Psychiatric:        Mood and Affect: Mood normal.        Behavior: Behavior normal.        Thought Content: Thought content normal.        Judgment: Judgment normal.     BP 124/76 (BP Location: Right Arm, Patient Position: Sitting, Cuff Size: Normal)   Pulse 72   Temp (!) 97.5 F (36.4 C) (Oral)   Resp 16   Ht '5\' 3"'$  (1.6 m)   Wt 133 lb 12.8 oz (60.7 kg)   SpO2 99%   BMI 23.70 kg/m  Wt Readings from Last 3 Encounters:  09/13/22 133 lb 12.8 oz (60.7 kg)  03/08/22 130 lb 2 oz (59 kg)  04/23/21 128 lb 12.8 oz (58.4 kg)    Diabetic Foot Exam - Simple   No data filed    Lab Results  Component Value Date   WBC 5.3 09/06/2022   HGB 13.1 09/06/2022   HCT 40.4 09/06/2022   PLT 263.0 09/06/2022   GLUCOSE 102 (H) 09/06/2022   CHOL 236 (H) 09/06/2022   TRIG 120.0 09/06/2022   HDL 43.90 09/06/2022   LDLCALC 168 (H) 09/06/2022   ALT 18 09/06/2022   AST 17 09/06/2022   NA 140 09/06/2022   K 4.0 09/06/2022   CL 106 09/06/2022   CREATININE 0.81 09/06/2022   BUN 14 09/06/2022   CO2 26 09/06/2022   TSH 1.72 09/06/2022   HGBA1C 5.7 09/06/2022    Lab Results  Component Value Date   TSH 1.72 09/06/2022   Lab Results  Component Value Date   WBC 5.3 09/06/2022   HGB 13.1 09/06/2022   HCT 40.4 09/06/2022   MCV 81.4 09/06/2022   PLT 263.0 09/06/2022   Lab Results  Component Value Date   NA 140 09/06/2022   K 4.0 09/06/2022   CO2 26 09/06/2022   GLUCOSE 102 (H)  09/06/2022   BUN 14 09/06/2022   CREATININE 0.81 09/06/2022   BILITOT 0.3 09/06/2022   ALKPHOS 73 09/06/2022   AST 17 09/06/2022   ALT 18 09/06/2022   PROT 7.4 09/06/2022   ALBUMIN 4.2 09/06/2022   CALCIUM 9.2 09/06/2022   ANIONGAP 9 04/18/2019   GFR 89.17 09/06/2022   Lab Results  Component Value Date   CHOL 236 (H) 09/06/2022   Lab Results  Component Value Date   HDL 43.90 09/06/2022   Lab Results  Component Value Date   LDLCALC 168 (H) 09/06/2022   Lab Results  Component Value Date   TRIG 120.0 09/06/2022   Lab Results  Component Value Date   CHOLHDL 5 09/06/2022   Lab Results  Component Value Date   HGBA1C 5.7 09/06/2022       Assessment & Plan:  Hyperglycemia Assessment & Plan: hgba1c acceptable, minimize simple carbs. Increase exercise as tolerated.  Orders: -     Hemoglobin A1c; Future -     Comprehensive metabolic panel; Future -     TSH; Future  Hyperlipidemia, mild Assessment & Plan: Encourage heart healthy diet such as MIND or DASH diet, increase exercise, avoid trans fats, simple carbohydrates and processed foods, consider a krill or fish or flaxseed oil cap daily.   Orders: -     Lipid panel; Future -  TSH; Future  Vitamin D deficiency Assessment & Plan: Supplement and monitor  Orders: -     VITAMIN D 25 Hydroxy (Vit-D Deficiency, Fractures); Future  Preventative health care Assessment & Plan: Patient encouraged to maintain heart healthy diet, regular exercise, adequate sleep. Consider daily probiotics. Take medications as prescribed. Labs ordered and reviewed. Follows with [facility], reports she is up todate, request records   Anemia, unspecified type -     Iron, TIBC and Ferritin Panel; Future -     CBC with Differential/Platelet; Future    Penni Homans, MD

## 2023-02-16 ENCOUNTER — Encounter (INDEPENDENT_AMBULATORY_CARE_PROVIDER_SITE_OTHER): Payer: Self-pay

## 2023-03-07 ENCOUNTER — Other Ambulatory Visit (INDEPENDENT_AMBULATORY_CARE_PROVIDER_SITE_OTHER): Payer: Self-pay

## 2023-03-07 DIAGNOSIS — E785 Hyperlipidemia, unspecified: Secondary | ICD-10-CM

## 2023-03-07 DIAGNOSIS — R739 Hyperglycemia, unspecified: Secondary | ICD-10-CM

## 2023-03-07 DIAGNOSIS — E559 Vitamin D deficiency, unspecified: Secondary | ICD-10-CM

## 2023-03-07 DIAGNOSIS — D649 Anemia, unspecified: Secondary | ICD-10-CM

## 2023-03-07 LAB — CBC WITH DIFFERENTIAL/PLATELET
Basophils Absolute: 0 10*3/uL (ref 0.0–0.1)
Basophils Relative: 0.3 % (ref 0.0–3.0)
Eosinophils Absolute: 0.1 10*3/uL (ref 0.0–0.7)
Eosinophils Relative: 1.9 % (ref 0.0–5.0)
HCT: 41.5 % (ref 36.0–46.0)
Hemoglobin: 13.1 g/dL (ref 12.0–15.0)
Lymphocytes Relative: 34.4 % (ref 12.0–46.0)
Lymphs Abs: 1.7 10*3/uL (ref 0.7–4.0)
MCHC: 31.6 g/dL (ref 30.0–36.0)
MCV: 83.1 fl (ref 78.0–100.0)
Monocytes Absolute: 0.4 10*3/uL (ref 0.1–1.0)
Monocytes Relative: 8.6 % (ref 3.0–12.0)
Neutro Abs: 2.6 10*3/uL (ref 1.4–7.7)
Neutrophils Relative %: 54.8 % (ref 43.0–77.0)
Platelets: 243 10*3/uL (ref 150.0–400.0)
RBC: 5 Mil/uL (ref 3.87–5.11)
RDW: 14.5 % (ref 11.5–15.5)
WBC: 4.8 10*3/uL (ref 4.0–10.5)

## 2023-03-07 LAB — LIPID PANEL
Cholesterol: 259 mg/dL — ABNORMAL HIGH (ref 0–200)
HDL: 36.1 mg/dL — ABNORMAL LOW (ref >39.00)
LDL Cholesterol: 190 mg/dL — ABNORMAL HIGH (ref 0–99)
NonHDL: 222.53
Total CHOL/HDL Ratio: 7
Triglycerides: 162 mg/dL — ABNORMAL HIGH (ref 0.0–149.0)
VLDL: 32.4 mg/dL (ref 0.0–40.0)

## 2023-03-07 LAB — COMPREHENSIVE METABOLIC PANEL
ALT: 13 U/L (ref 0–35)
AST: 15 U/L (ref 0–37)
Albumin: 4.2 g/dL (ref 3.5–5.2)
Alkaline Phosphatase: 73 U/L (ref 39–117)
BUN: 9 mg/dL (ref 6–23)
CO2: 25 mEq/L (ref 19–32)
Calcium: 9.3 mg/dL (ref 8.4–10.5)
Chloride: 106 mEq/L (ref 96–112)
Creatinine, Ser: 0.83 mg/dL (ref 0.40–1.20)
GFR: 86.29 mL/min (ref >60.00)
Glucose, Bld: 96 mg/dL (ref 70–99)
Potassium: 4.2 mEq/L (ref 3.5–5.1)
Sodium: 139 mEq/L (ref 135–145)
Total Bilirubin: 0.4 mg/dL (ref 0.2–1.2)
Total Protein: 7.5 g/dL (ref 6.0–8.3)

## 2023-03-07 LAB — HEMOGLOBIN A1C: Hgb A1c MFr Bld: 5.8 % (ref 4.6–6.5)

## 2023-03-07 LAB — TSH: TSH: 1.57 u[IU]/mL (ref 0.35–5.50)

## 2023-03-07 LAB — VITAMIN D 25 HYDROXY (VIT D DEFICIENCY, FRACTURES): VITD: 65.94 ng/mL (ref 30.00–100.00)

## 2023-03-08 LAB — IRON,TIBC AND FERRITIN PANEL
%SAT: 18 % (ref 16–45)
Ferritin: 3 ng/mL — ABNORMAL LOW (ref 16–232)
Iron: 86 ug/dL (ref 40–190)
TIBC: 482 ug/dL — ABNORMAL HIGH (ref 250–450)

## 2023-03-13 NOTE — Assessment & Plan Note (Signed)
Improved on last recheck

## 2023-03-13 NOTE — Assessment & Plan Note (Signed)
hgba1c acceptable, minimize simple carbs. Increase exercise as tolerated.  

## 2023-03-13 NOTE — Assessment & Plan Note (Signed)
Supplement and monitor 

## 2023-03-14 ENCOUNTER — Encounter: Payer: Self-pay | Admitting: Family Medicine

## 2023-03-14 ENCOUNTER — Ambulatory Visit (INDEPENDENT_AMBULATORY_CARE_PROVIDER_SITE_OTHER): Payer: No Typology Code available for payment source | Admitting: Family Medicine

## 2023-03-14 VITALS — BP 127/79 | HR 86 | Temp 98.0°F | Resp 16 | Ht 63.0 in | Wt 132.4 lb

## 2023-03-14 DIAGNOSIS — M79671 Pain in right foot: Secondary | ICD-10-CM | POA: Diagnosis not present

## 2023-03-14 DIAGNOSIS — E559 Vitamin D deficiency, unspecified: Secondary | ICD-10-CM | POA: Diagnosis not present

## 2023-03-14 DIAGNOSIS — R739 Hyperglycemia, unspecified: Secondary | ICD-10-CM | POA: Diagnosis not present

## 2023-03-14 DIAGNOSIS — D508 Other iron deficiency anemias: Secondary | ICD-10-CM | POA: Diagnosis not present

## 2023-03-14 DIAGNOSIS — M79672 Pain in left foot: Secondary | ICD-10-CM

## 2023-03-14 NOTE — Patient Instructions (Addendum)
[employer] online  [employer]  [employer]  Net[employer]  hydrate  [employer]  Foot Pain Many things can cause foot pain. Common causes include injuries to the foot. The injuries include sprains or broken bones, or injuries that affect the nerves in the feet. Other causes of foot pain include arthritis, blisters, and bunions. To know what causes your foot pain, your health care provider will take a detailed history of your symptoms. They will also do a physical exam as well as imaging tests, such as X-ray or MRI. Follow these instructions at home: Managing pain, stiffness, and swelling  If told, put ice on the painful area. Put ice in a plastic bag. Place a towel between your skin and the bag. Leave the ice on for 20 minutes, 2-3 times a day. If your skin turns bright red, remove the ice right away to prevent skin damage. The risk of damage is higher if you cannot feel pain, heat, or cold. Activity Do not stand or walk for long periods. Do stretches to relieve foot pain and stiffness as told by your provider. Do not lift anything that is heavier than 10 lb (4.5 kg), or the limit that you are told, until your provider says that it is safe. Lifting a lot of weight can put added pressure on your feet. Return to your normal activities as told by your provider. Ask your provider what activities are safe for you. Lifestyle Wear comfortable, supportive shoes that fit you well. Do not wear high heels. Keep your feet clean and dry. General instructions Take over-the-counter and prescription medicines only as told by your provider. Rub your foot gently. Pay attention to any changes in your symptoms. Let your provider know if symptoms become worse. Keep all follow-up visits. Your provider will want to monitor your progress. Contact a health care provider if: Your pain does not get better after a few days of treatment at home. Your pain gets worse. You cannot stand on your  foot. Your foot or toes are swollen. Your foot is numb or tingling. Get help right away if: Your foot or toes turn white or blue. You have warmth and redness along your foot. This information is not intended to replace advice given to you by your health care provider. Make sure you discuss any questions you have with your health care provider. Document Revised: 07/29/2022 Document Reviewed: 04/06/2022 Elsevier Patient Education  2024 [employer].

## 2023-03-14 NOTE — Progress Notes (Signed)
Subjective:    Patient ID: Tara Stevenson, female    DOB: 10/27/1980, 42 y.o.   MRN: [id number]  Chief Complaint  Patient presents with  . Follow-up    Follow up    HPI Discussed the use of AI scribe software for clinical note transcription with the patient, who gave verbal consent to proceed.  History of Present Illness   The patient, with a history of high cholesterol, presents with persistent foot pain, dizziness, and menopausal symptoms. The foot pain is described as feeling like a rock under the feet, particularly in the morning, and is exacerbated by wearing sandals. The patient reports feeling dizzy often, especially when out and about, to the point of feeling like they might faint. The patient also reports experiencing menopausal symptoms, including hot flashes and night sweats. The patient has noticed significant hair loss and weight gain, which they attribute to menopause and dietary habits. The patient is currently working with a Systems analyst twice a week and is trying to improve their diet and hydration. The patient has a family history of high blood pressure and diabetes, but no history of heart disease.        Past Medical History:  Diagnosis Date  . Allergy   . Anemia   . Cervical radiculitis   . Heart murmur   . Hx of typhoid fever   . Hyperglycemia 04/15/2016  . Hyperlipidemia, mild 04/15/2016  . Left shoulder pain 04/15/2016  . Lightheadedness 11/04/2016  . Lumbar spine pain   . Tachycardia 11/04/2016  . Thoracic spine pain     Past Surgical History:  Procedure Laterality Date  . CESAREAN SECTION     2005  . HERNIA REPAIR     2007    Family History  Problem Relation Age of Onset  . Cancer Mother 88       uterine cancer with ovarian cyst  . Hypertension Mother   . Osteoarthritis Mother   . Arthritis Father   . Hypertension Father   . Hyperlipidemia Father   . Diabetes Father   . Hypertension Maternal Grandmother   . Arthritis Maternal  Grandmother   . Diabetes Maternal Grandfather   . Stroke Maternal Grandfather   . Hypertension Maternal Grandfather   . Arthritis Paternal Grandmother   . Hypertension Paternal Grandmother   . Diabetes Paternal Grandmother   . Cerebrovascular Accident Paternal Grandmother   . Hypertension Paternal Grandfather   . Cerebrovascular Accident Paternal Grandfather   . Breast cancer Neg Hx     Social History   Socioeconomic History  . Marital status: Married    Spouse name: Not on file  . Number of children: 2  . Years of education: BS degree  . Highest education level: Not on file  Occupational History  . Occupation: Self employeed  Tobacco Use  . Smoking status: Never  . Smokeless tobacco: Never  Substance and Sexual Activity  . Alcohol use: No    Alcohol/week: 0.0 standard drinks of alcohol  . Drug use: No  . Sexual activity: Yes    Birth control/protection: None  Other Topics Concern  . 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 Cholecalciferol (VITAMIN D) 125 MCG (5000 UT) CAPS Take 1 capsule by mouth daily.    . Ferrous Sulfate (IRON PO) Take by mouth.    Marland Kitchen KURVELO 0.15-30 MG-MCG tablet Take 1 tablet by mouth daily.    . Vitamin D, Ergocalciferol, (DRISDOL) 1.25 MG (50000 UNIT) CAPS capsule TAKE 1 CAPSULE BY MOUTH EVERY 7 DAYS 4 capsule 0  . ferrous fumarate (HEMOCYTE - 106  MG FE) 325 (106 Fe) MG TABS tablet Take 1 tablet (106 mg of iron total) by mouth daily. 30 tablet 3  . Vitamin D, Ergocalciferol, (DRISDOL) 1.25 MG (50000 UNIT) CAPS capsule Take 1 capsule (50,000 Units total) by mouth every 7 (seven) days. 5 capsule 4   No facility-administered medications prior to visit.    Allergies  Allergen Reactions  . Flonase [Fluticasone Propionate]     Anxious, restless    Review of Systems  Constitutional:  Negative for fever and malaise/fatigue.  HENT:  Negative for congestion.   Eyes:  Negative for blurred vision.  Respiratory:  Negative for shortness of breath.   Cardiovascular:  Negative for chest pain, palpitations and leg swelling.  Gastrointestinal:  Negative for abdominal pain, blood in stool and nausea.  Genitourinary:  Negative for dysuria and frequency.  Musculoskeletal:  Positive for joint pain. Negative for falls.  Skin:  Negative for rash.  Neurological:  Positive for dizziness. Negative for loss of consciousness and headaches.  Endo/Heme/Allergies:  Negative for environmental allergies.  Psychiatric/Behavioral:  Negative for depression. The patient is not nervous/anxious.        Objective:    Physical Exam Constitutional:      General: She is not in acute distress.    Appearance: Normal appearance. She is well-developed. She is not toxic-appearing.  HENT:     Head: Normocephalic and atraumatic.     Right Ear: External ear normal.     Left Ear: External ear normal.     Nose: Nose normal.  Eyes:     General:        Right eye: No discharge.        Left eye: No discharge.     Conjunctiva/sclera: Conjunctivae normal.  Neck:     Thyroid: No thyromegaly.  Cardiovascular:     Rate and Rhythm: Normal rate and regular rhythm.     Heart sounds: Normal heart sounds. No murmur heard. Pulmonary:     Effort: Pulmonary effort is normal. No respiratory distress.     Breath sounds: Normal breath sounds.  Abdominal:     General: Bowel sounds  are normal.     Palpations: Abdomen is soft.     Tenderness: There is no abdominal tenderness. There is no guarding.  Musculoskeletal:        General: Normal range of motion.     Cervical back: Neck supple.  Lymphadenopathy:     Cervical: No cervical adenopathy.  Skin:    General: Skin is warm and dry.  Neurological:     Mental Status: She is alert and oriented to person, place, and time.  Psychiatric:        Mood and Affect: Mood normal.        Behavior: Behavior normal.        Thought Content: Thought content normal.        Judgment: Judgment normal.   BP 127/79 (BP Location: Left Arm, Patient Position: Sitting, Cuff Size: Normal)   Pulse 86   Temp 98 F (36.7 C) (Oral)   Resp 16   Ht 5\' 3"  (1.6 m)   Wt 132 lb 6.4 oz (60.1 kg)   SpO2 100%   BMI 23.45 kg/m  Wt Readings from Last 3 Encounters:  03/14/23 132 lb 6.4 oz (60.1 kg)  09/13/22 133 lb 12.8 oz (60.7 kg)  03/08/22 130 lb 2 oz (59 kg)    Diabetic Foot Exam - Simple   No data filed    Lab Results  Component Value Date   WBC 4.8 03/07/2023   HGB 13.1 03/07/2023   HCT 41.5 03/07/2023   PLT 243.0 03/07/2023   GLUCOSE 96 03/07/2023   CHOL 259 (H) 03/07/2023   TRIG 162.0 (H) 03/07/2023   HDL 36.10 (L) 03/07/2023   LDLCALC 190 (H) 03/07/2023   ALT 13 03/07/2023   AST 15 03/07/2023   NA 139 03/07/2023   K 4.2 03/07/2023   CL 106 03/07/2023   CREATININE 0.83 03/07/2023   BUN 9 03/07/2023   CO2 25 03/07/2023   TSH 1.57 03/07/2023   HGBA1C 5.8 03/07/2023    Lab Results  Component Value Date   TSH 1.57 03/07/2023   Lab Results  Component Value Date   WBC 4.8 03/07/2023   HGB 13.1 03/07/2023   HCT 41.5 03/07/2023   MCV 83.1 03/07/2023   PLT 243.0 03/07/2023   Lab Results  Component Value Date   NA 139 03/07/2023   K 4.2 03/07/2023   CO2 25 03/07/2023   GLUCOSE 96 03/07/2023   BUN 9 03/07/2023   CREATININE 0.83 03/07/2023   BILITOT 0.4 03/07/2023   ALKPHOS 73 03/07/2023   AST 15 03/07/2023    ALT 13 03/07/2023   PROT 7.5 03/07/2023   ALBUMIN 4.2 03/07/2023   CALCIUM 9.3 03/07/2023   ANIONGAP 9 04/18/2019   GFR 86.29 03/07/2023   Lab Results  Component Value Date   CHOL 259 (H) 03/07/2023   Lab Results  Component Value Date   HDL 36.10 (L) 03/07/2023   Lab Results  Component Value Date   LDLCALC 190 (H) 03/07/2023   Lab Results  Component Value Date   TRIG 162.0 (H) 03/07/2023   Lab Results  Component Value Date   CHOLHDL 7 03/07/2023   Lab Results  Component Value Date   HGBA1C 5.8 03/07/2023       Assessment & Plan:  Other iron deficiency anemia Assessment & Plan: Improved on last recheck   Hyperglycemia Assessment & Plan: hgba1c acceptable, minimize simple carbs. Increase exercise as tolerated.   Vitamin D deficiency Assessment & Plan: Supplement and monitor   Foot pain, bilateral -     Ambulatory referral to Podiatry    Assessment and Plan    Foot Pain Morning foot pain described as a rock under the foot, worsened by wearing flat shoes. Likely due to poor footwear and age-related arch flattening. -Refer to podiatry for further evaluation and management.  Hyperlipidemia Elevated cholesterol levels, despite a diet low in fried foods. Family history of hypertension and diabetes, but no early heart disease. -Continue lifestyle modifications including diet and exercise. -Repeat  lipid panel in 3 months with addition of Apolipoprotein B to assess for genetic predisposition.  Menopausal Symptoms Reports of night sweats and weight gain, likely related to menopause. -Advise on dietary modifications, particularly reducing carbohydrate intake, to help manage symptoms.  Dizziness Reports of feeling faint and dizzy, particularly when looking up or moving quickly. Noted nystagmus on examination, suggesting a possible vestibular issue. -Consider vestibular rehabilitation therapy. -Advise on maintaining hydration and moving slowly to prevent  sudden blood pressure drops. -Recommend purchase of CardiaMobile to monitor for any cardiac arrhythmias during episodes of dizziness.  Muscle Tension Reports of muscle tension and discomfort, particularly in the sternocleidomastoid muscle. -Advise on regular stretching, exercise, and possibly massage to help manage muscle tension.  General Health Maintenance -Continue taking Biotin, multivitamin, and fatty acids for hair health. -Consider trying Xyzal for allergy symptoms. -Schedule physical in 6 months and consider a 27-month follow-up visit.         Danise Edge, MD

## 2023-04-22 ENCOUNTER — Ambulatory Visit: Payer: 59 | Admitting: Podiatry

## 2023-04-25 ENCOUNTER — Ambulatory Visit (INDEPENDENT_AMBULATORY_CARE_PROVIDER_SITE_OTHER): Payer: 59

## 2023-04-25 ENCOUNTER — Ambulatory Visit (INDEPENDENT_AMBULATORY_CARE_PROVIDER_SITE_OTHER): Payer: 59 | Admitting: Podiatry

## 2023-04-25 DIAGNOSIS — M778 Other enthesopathies, not elsewhere classified: Secondary | ICD-10-CM

## 2023-04-25 DIAGNOSIS — G5762 Lesion of plantar nerve, left lower limb: Secondary | ICD-10-CM | POA: Diagnosis not present

## 2023-04-25 DIAGNOSIS — D3613 Benign neoplasm of peripheral nerves and autonomic nervous system of lower limb, including hip: Secondary | ICD-10-CM

## 2023-04-25 DIAGNOSIS — G5761 Lesion of plantar nerve, right lower limb: Secondary | ICD-10-CM | POA: Diagnosis not present

## 2023-04-25 MED ORDER — MELOXICAM 7.5 MG PO TABS: 7.5000 mg | ORAL_TABLET | Freq: Every day | ORAL | 0 refills | Status: DC

## 2023-04-25 NOTE — Progress Notes (Unsigned)
Chief Complaint  Patient presents with   Foot Pain    Rm 17- for about a year has had foot stiffness in the morning.  Sharp pain on the front of the foot and medial to the medial malleolus left.  Toes 2,3,4 feel "frozen: on the left foot.  Sometimes pain in left great toe.  Sandals are the most uncomfortable.     HPI: 42 y.o. female presenting today with c/o pain, burning/tingling, noted near the bilateral 2nd interspace.  Patient describes pain on the ball of the foot when standing.  Denies wearing too small/too tight shoes recently.  Denies injury. She likes to wear sandals.  States the 2nd, 3rd, and 4th toes bilateral sometimes feel "frozen" (numb and stiff, but not cold).  She also notes some pain along the outer midfoot/rearfoot area.  Past Medical History:  Diagnosis Date   Allergy    Anemia    Cervical radiculitis    Heart murmur    Hx of typhoid fever    Hyperglycemia 04/15/2016   Hyperlipidemia, mild 04/15/2016   Left shoulder pain 04/15/2016   Lightheadedness 11/04/2016   Lumbar spine pain    Tachycardia 11/04/2016   Thoracic spine pain     Past Surgical History:  Procedure Laterality Date   CESAREAN SECTION     2005   HERNIA REPAIR     2007    Allergies  Allergen Reactions   Flonase [Fluticasone Propionate]     Anxious, restless    PHYSICAL EXAM: General: The patient is alert and oriented x3 in no acute distress.  Dermatology: Skin is warm, dry and supple bilateral lower extremities. Interspaces are clear of maceration and debris.  No ecchymosis.  Vascular: Palpable pedal pulses bilaterally. Capillary refill within normal limits.  Minimal edema in plantar 2nd toe sulcus area bilateral.  No erythema or calor.  Neurological: Light touch sensation grossly intact bilateral feet.   Musculoskeletal Exam: No pedal deformities noted.  No pain on palpation of the metatarsals.  No pain with ROM of the MPJ's.  There is a + Muldor's sign with compression of the bilateral  2nd interspace.  There is pain with compression of the 2nd interspaces.  Mild pain on palpation of lateral sinus tarsi joints.  RADIOGRAPHIC EXAM (bilateral foot, 3 weightbearing views, 04/25/2023):  Normal osseous mineralization. Long 2nd metatarsal bilateral.  No fracture or periosteal reaction seen bilateral.   ASSESSMENT / PLAN OF CARE: 1. Neuroma of second interspace of left foot   2. Capsulitis of right foot   3. Capsulitis of left foot   4. Neuroma of second interspace of right foot     Meds ordered this encounter  Medications   meloxicam (MOBIC) 7.5 MG tablet    Sig: Take 1 tablet (7.5 mg total) by mouth daily.    Dispense:  30 tablet    Refill:  0   DG FOOT COMPLETE LEFT DG FOOT COMPLETE RIGHT PR FT ARCH SUPRT PREMOLD LONGIT  Discussed patient's condition today and possible etiologies.  Discussed conservative treatment options, including off-loading, shoe modifications, cortisone injection, NSAID use, and possible surgery if conservative options fail.  With the patient's verbal consent, a corticosteroid injection was adminstered to the bilateral 2nd interspace.  This consisted of a mixture of 1% lidocaine plain, 0.5% sensorcaine plain, and Kenalog-10 for a total of 1.25cc's administered to each interspace.  Bandaid applied. Patient tolerated these well.     Powerstep inserts dispensed today to wear in sneakers.  Rx meloxicam for pain relief  Return if symptoms worsen or fail to improve.   Clerance Lav, DPM, FACFAS [facility]     [address] [place], [place] [address]                Office [phone]  Fax [phone]

## 2023-05-26 IMAGING — DX DG KNEE COMPLETE 4+V*R*
4 series · 4 of 4 positions shown · non-contrast
Comparison: None.

CLINICAL DATA: Bilateral knee pain, left greater than right,
history of remote trauma

EXAM:
RIGHT KNEE - COMPLETE 4+ VIEW; LEFT KNEE - COMPLETE 4+ VIEW

[knee ap]
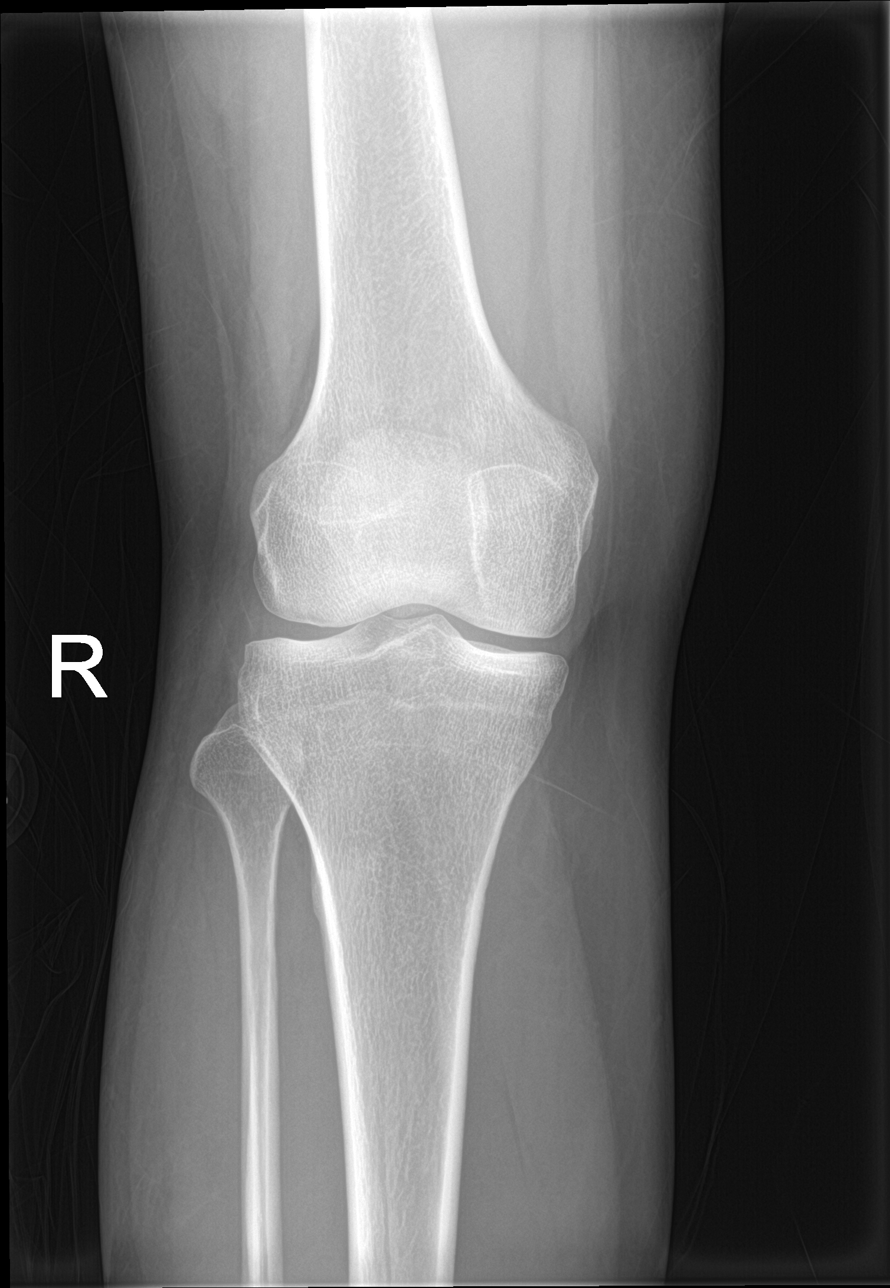

[knee obl (1 of 2)]
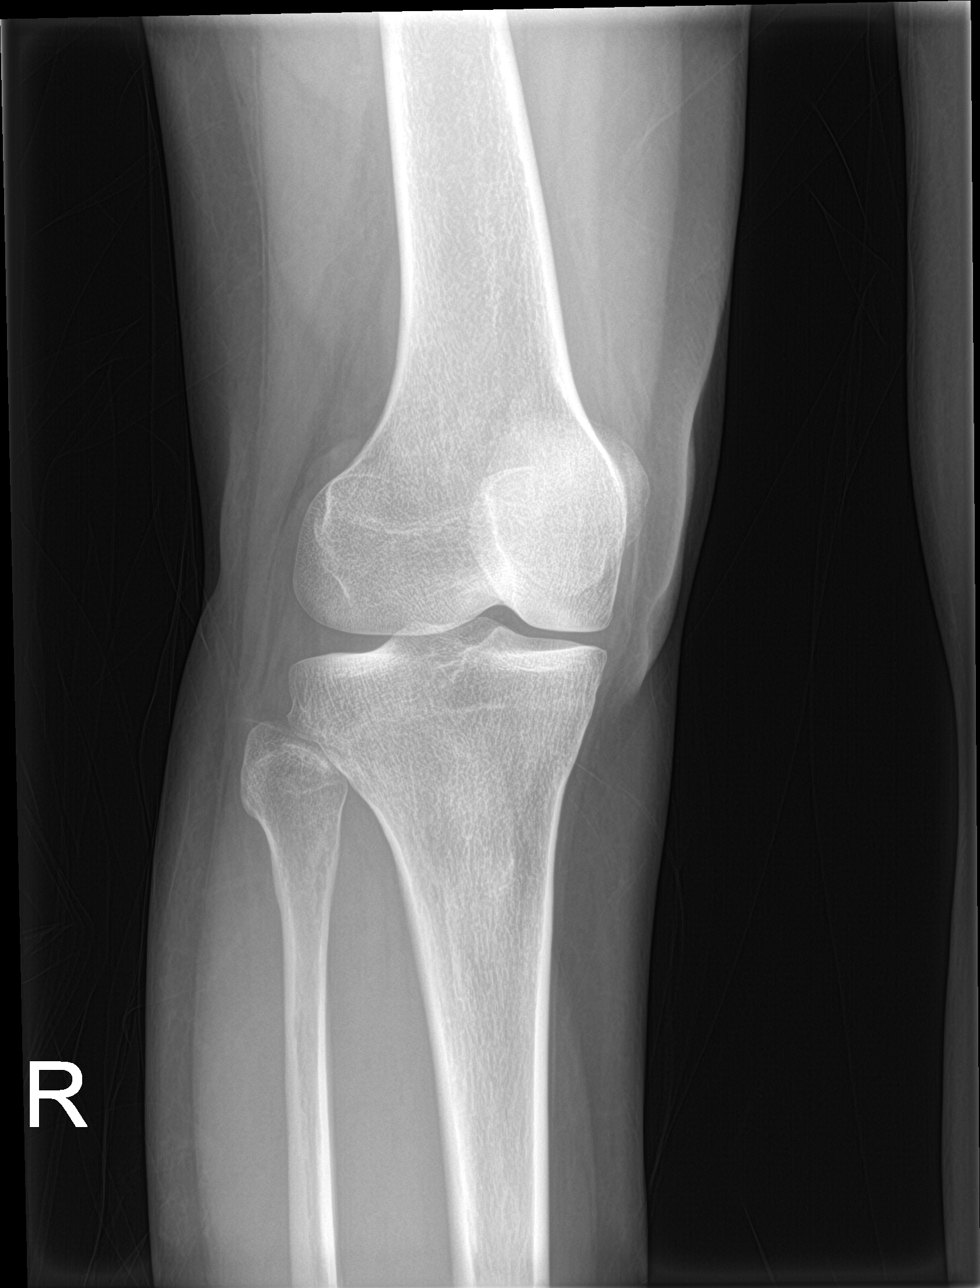

[knee obl (2 of 2)]
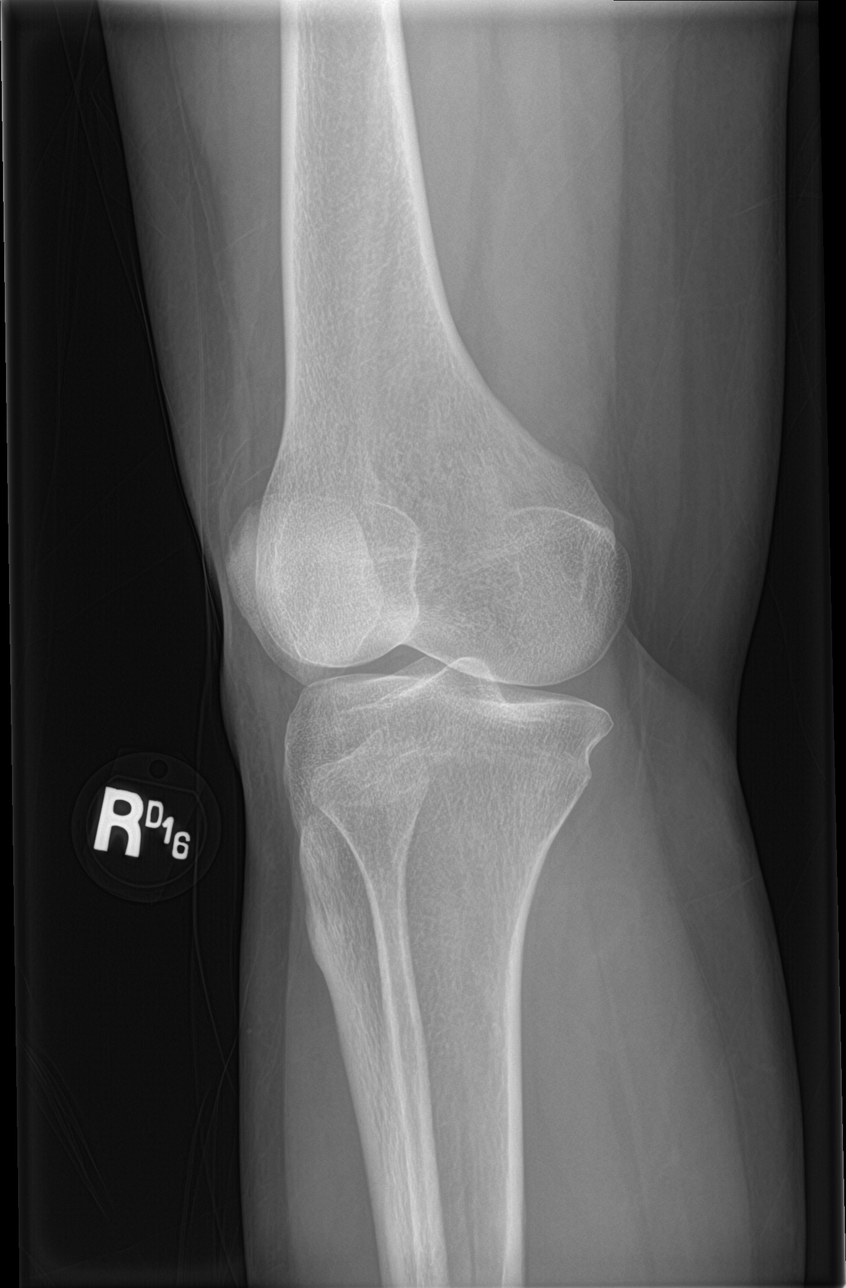

[knee lat]
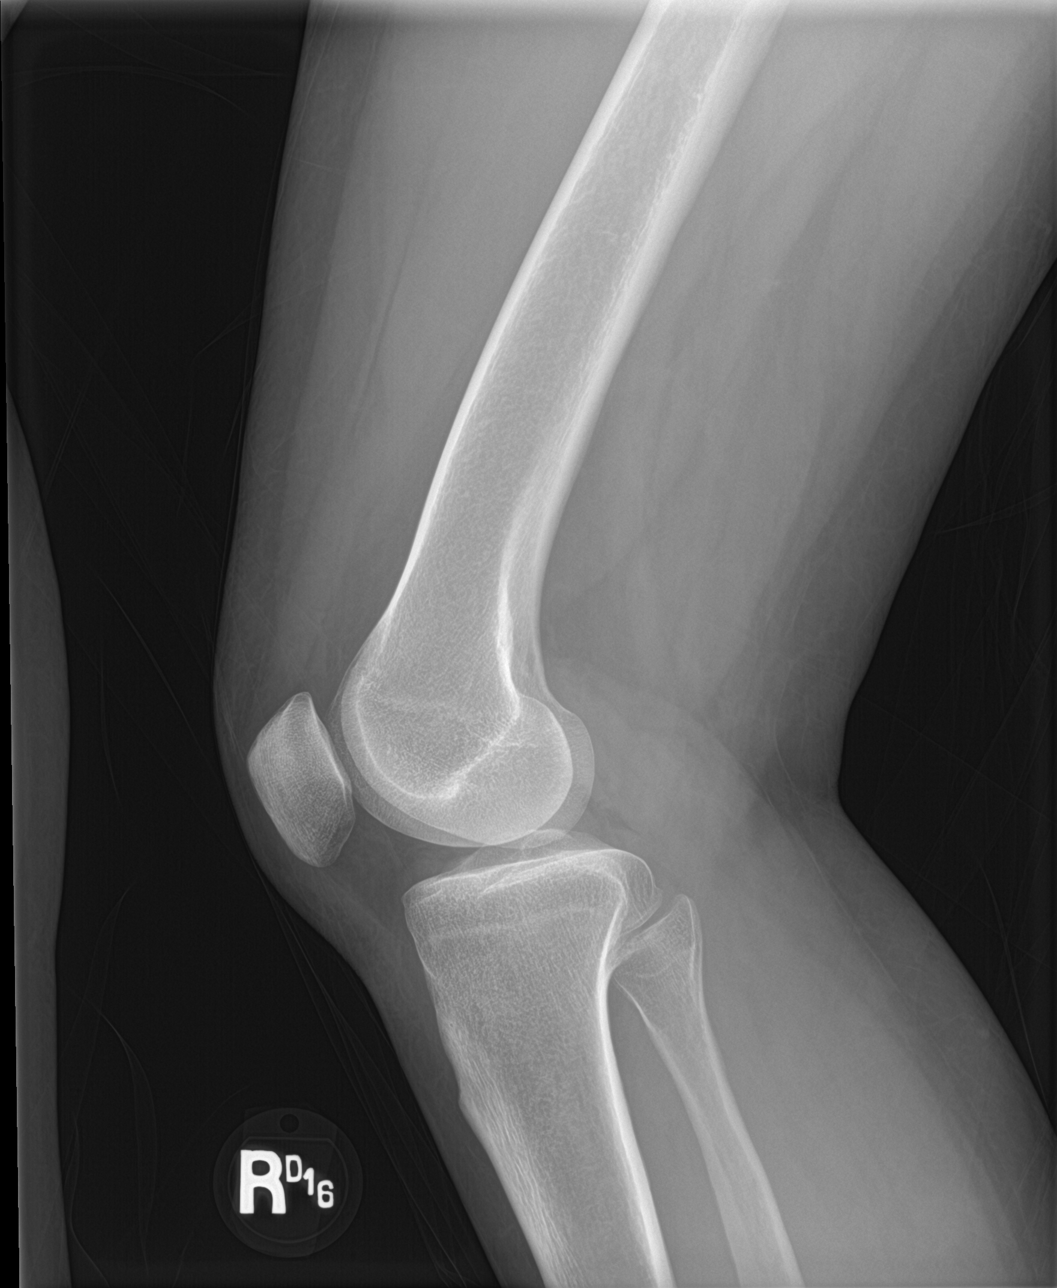

[4 of 4 positions shown; findings below may reference images not displayed]

FINDINGS: Right knee: Frontal, bilateral oblique, lateral views demonstrate no
fracture, subluxation, or dislocation. Joint spaces are well
preserved. No joint effusion. Soft tissues are unremarkable.

Left knee: Frontal, bilateral oblique, and lateral views demonstrate
no fracture, subluxation, or dislocation. Joint spaces are well
preserved. No joint effusion. Soft tissues are unremarkable.
IMPRESSION: 1. Unremarkable bilateral knees.

## 2023-06-12 NOTE — Assessment & Plan Note (Signed)
hgba1c acceptable, minimize simple carbs. Increase exercise as tolerated.  

## 2023-06-12 NOTE — Assessment & Plan Note (Signed)
Encourage heart healthy diet such as MIND or DASH diet, increase exercise, avoid trans fats, simple carbohydrates and processed foods, consider a krill or fish or flaxseed oil cap daily.  °

## 2023-06-12 NOTE — Assessment & Plan Note (Signed)
Supplement and monitor 

## 2023-06-14 ENCOUNTER — Encounter: Payer: Self-pay | Admitting: Family Medicine

## 2023-06-14 ENCOUNTER — Ambulatory Visit (INDEPENDENT_AMBULATORY_CARE_PROVIDER_SITE_OTHER): Payer: 59 | Admitting: Family Medicine

## 2023-06-14 VITALS — BP 124/76 | HR 84 | Temp 98.2°F | Resp 18 | Ht 63.0 in | Wt 132.4 lb

## 2023-06-14 DIAGNOSIS — E785 Hyperlipidemia, unspecified: Secondary | ICD-10-CM

## 2023-06-14 DIAGNOSIS — E559 Vitamin D deficiency, unspecified: Secondary | ICD-10-CM

## 2023-06-14 DIAGNOSIS — R739 Hyperglycemia, unspecified: Secondary | ICD-10-CM | POA: Diagnosis not present

## 2023-06-14 DIAGNOSIS — T7840XA Allergy, unspecified, initial encounter: Secondary | ICD-10-CM | POA: Diagnosis not present

## 2023-06-14 MED ORDER — AZELASTINE HCL 0.1 % NA SOLN: 1.0000 | Freq: Two times a day (BID) | NASAL | 5 refills | Status: DC

## 2023-06-14 NOTE — Patient Instructions (Addendum)
Fiber such as Benefiber and Metamucil/Psyllium caps and 60-80 ounces of water daily and exercise  Probiotics such as [employer], [employer] online  Allergies, Adult An allergy is a condition that causes the body's defense system (immune system) to react too strongly to an allergen. An allergen is a substance that is harmless to most people but can cause a reaction in some people. Allergies often affect the nose (allergic rhinitis), eyes (conjunctivitis), skin (atopic dermatitis), and stomach. They can be mild, moderate, or severe. They cannot spread from person to person. Allergies can start at any age. In some cases, they may go away as you get older. What are the causes? Allergies are caused by allergens. These may be: Outdoor allergens. These include pollen, car fumes, and mold. Indoor allergens. These include dust, smoke, mold, and pet dander. Other allergens. These include foods, medicines, scents, and insect bites or stings. What increases the risk? You are more likely to have allergies if you have: Family members with allergies. Family members who have a condition that may be caused by allergens, such as asthma. What are the signs or symptoms? Symptoms depend on how severe your allergy is. Mild to moderate symptoms Runny nose, stuffy nose (nasal congestion), or sneezing. Itchy mouth, ears, or throat. Postnasal drip. This is a feeling of mucus dripping down the back of your throat. Sore throat. Itchy, red, watery, or puffy eyes. Skin rash, or itchy, red, swollen areas of skin (hives). Stomach cramps or bloating. Severe symptoms A bad allergy to food, medicine, or insect bites may cause a severe reaction (anaphylactic reaction). Symptoms include: A red face. Coughing or making high-pitched whistling sounds when you breathe, most often when you breathe out (wheezing). Swollen lips, tongue, or mouth. A tight or swollen throat. Chest pain or tightness, or a fast heartbeat. Trouble  breathing or shortness of breath. Pain in your abdomen. Vomiting or diarrhea. Feeling dizzy or fainting. How is this diagnosed? Allergies are diagnosed based on your symptoms, your family and medical history, and a physical exam. You may also have tests, such as: Skin tests. These may be done to see how your skin reacts to allergens. Tests include: Skin prick test. For this test, the allergen is put in your body through a small prick in the skin. Intradermal skin test. For this test, a small amount of the allergen is put under the first layer of your skin. Patch test. For this test, a small amount of the allergen is placed on your skin. The area is covered and then checked after a few days. Blood tests. A challenge test. For this test, you eat or breathe in the allergen to see if you have a reaction. You may also be asked to: Keep a food diary. This means writing down all the foods, drinks, and symptoms you have in a day. Try an elimination diet. To do this: Stop eating certain foods. Add those foods back one by one to find out if any of them cause a reaction. How is this treated?     Treatment for allergies depends on your symptoms. It may include: Cold, wet cloths (cold compresses). These can be used to soothe itching and swelling. Eye drops or nasal sprays. A saline solution to clear out your nose and keep it moist (nasal irrigation). A saline solution is made of salt and water. A humidifier. This can add moisture to the air. Skin creams. These can treat rashes or itching. Diet changes to cut out foods that cause  allergies. Being exposed again and again to tiny amounts of allergens. This can help your body build a defense against them (tolerance). This process is called immunotherapy. It may be done using: Allergy shots. This is when you get a shot of the allergen. Sublingual immunotherapy. This is when you take a small dose of the allergen under your tongue. Allergy medicines  (antihistamines) or other medicines. These can help block the allergic reaction. Using an auto-injector pen. An auto-injector pen is a device filled with medicine that gives an emergency shot of epinephrine. Your health care provider will teach you how to use it. Follow these instructions at home: Medicines  Take or apply over-the-counter and prescription medicines only as told by your provider. Always carry your auto-injector pen if you are at risk of an anaphylactic reaction. Give yourself the shot as told by your provider. Eating and drinking Follow instructions from your provider about what you may eat and drink. Drink enough fluid to keep your pee (urine) pale yellow. General instructions Wear a medical alert bracelet or necklace if you have had an anaphylactic reaction in the past. Avoid known allergens when you can. Keep all follow-up visits. Your provider will watch your symptoms and talk about treatment options with you. Contact a health care provider if: Your symptoms do not get better with treatment. Get help right away if: You have any symptoms of anaphylactic reaction. You use an auto-injector pen. You will need more medical care even if the medicine seems to be working. An anaphylactic reaction may happen again within 72 hours (rebound anaphylaxis). These symptoms may be an emergency. Use the auto-injector pen right away. Then call 911. Do not wait to see if the symptoms will go away. Do not drive yourself to the hospital. This information is not intended to replace advice given to you by your health care provider. Make sure you discuss any questions you have with your health care provider. Document Revised: 03/17/2022 Document Reviewed: 03/17/2022 Elsevier Patient Education  2024 [employer].

## 2023-06-15 ENCOUNTER — Encounter: Payer: Self-pay | Admitting: Family Medicine

## 2023-06-15 NOTE — Progress Notes (Signed)
Subjective:    Patient ID: Tara Stevenson, female    DOB: 01/23/1981, 42 y.o.   MRN: [id number]  No chief complaint on file.   HPI Discussed the use of AI scribe software for clinical note transcription with the patient, who gave verbal consent to proceed.  History of Present Illness   The patient, with a history of allergies, presents with increased gassiness and constipation. She reports feeling as though her stomach is not fully emptying despite bowel movements twice a day. The patient describes her stools as hard and feels the need to strain during bowel movements. She also reports being very gassy throughout the day. The patient has been taking a prenatal vitamin and a probiotic supplement. She has also been trying to improve her diet by reducing her intake of fried foods and carbohydrates, and increasing her intake of whole grains. The patient is physically active, walking regularly and monitoring her steps. She also reports a decrease in allergy symptoms since receiving the COVID-19 vaccine.        Past Medical History:  Diagnosis Date  . Allergy   . Anemia   . Cervical radiculitis   . Heart murmur   . Hx of typhoid fever   . Hyperglycemia 04/15/2016  . Hyperlipidemia, mild 04/15/2016  . Left shoulder pain 04/15/2016  . Lightheadedness 11/04/2016  . Lumbar spine pain   . Tachycardia 11/04/2016  . Thoracic spine pain     Past Surgical History:  Procedure Laterality Date  . CESAREAN SECTION     2005  . HERNIA REPAIR     2007    Family History  Problem Relation Age of Onset  . Cancer Mother 73       uterine cancer with ovarian cyst  . Hypertension Mother   . Osteoarthritis Mother   . Arthritis Father   . Hypertension Father   . Hyperlipidemia Father   . Diabetes Father   . Hypertension Maternal Grandmother   . Arthritis Maternal Grandmother   . Diabetes Maternal Grandfather   . Stroke Maternal Grandfather   . Hypertension Maternal Grandfather   . Arthritis  Paternal Grandmother   . Hypertension Paternal Grandmother   . Diabetes Paternal Grandmother   . Cerebrovascular Accident Paternal Grandmother   . Hypertension Paternal Grandfather   . Cerebrovascular Accident Paternal Grandfather   . Breast cancer Neg Hx     Social History   Socioeconomic History  . Marital status: Married    Spouse name: Not on file  . Number of children: 2  . Years of education: BS degree  . Highest education level: Not on file  Occupational History  . Occupation: Self employeed  Tobacco Use  . Smoking status: Never  . Smokeless tobacco: Never  Substance and Sexual Activity  . Alcohol use: No    Alcohol/week: 0.0 standard drinks of alcohol  . Drug use: No  . Sexual activity: Yes    Birth control/protection: None  Other Topics Concern  . 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 Cholecalciferol (VITAMIN D) 125 MCG (5000 UT) CAPS Take 1 capsule by mouth daily.    . Ferrous Sulfate (IRON PO) Take by mouth.    Marland Kitchen KURVELO 0.15-30 MG-MCG tablet Take 1 tablet by mouth daily.    . meloxicam (MOBIC) 7.5 MG tablet Take 1 tablet (7.5 mg total) by mouth daily. 30 tablet 0  . Vitamin D, Ergocalciferol, (DRISDOL) 1.25 MG (50000 UNIT) CAPS capsule TAKE 1 CAPSULE BY MOUTH EVERY 7 DAYS 4 capsule 0   No facility-administered medications prior to visit.    Allergies   Allergen Reactions  . Flonase [Fluticasone Propionate]     Anxious, restless    Review of Systems  Constitutional:  Negative for fever and malaise/fatigue.  HENT:  Negative for congestion.   Eyes:  Negative for blurred vision.  Respiratory:  Negative for shortness of breath.   Cardiovascular:  Negative for chest pain, palpitations and leg swelling.  Gastrointestinal:  Positive for heartburn. Negative for abdominal pain, blood in stool and nausea.  Genitourinary:  Negative for dysuria and frequency.  Musculoskeletal:  Negative for falls.  Skin:  Negative for rash.  Neurological:  Negative for dizziness, loss of consciousness and headaches.  Endo/Heme/Allergies:  Negative for environmental allergies.  Psychiatric/Behavioral:  Negative for depression. The patient is not nervous/anxious.       Objective:    Physical Exam Constitutional:      General: She is not in acute distress.    Appearance: Normal appearance. She is well-developed. She is not toxic-appearing.  HENT:     Head: Normocephalic and atraumatic.     Right Ear: External ear normal.     Left Ear: External ear normal.     Nose: Nose normal.  Eyes:     General:        Right eye: No discharge.        Left eye: No discharge.     Conjunctiva/sclera: Conjunctivae normal.  Neck:     Thyroid: No thyromegaly.  Cardiovascular:     Rate and Rhythm: Normal rate and regular rhythm.     Heart sounds: Normal heart sounds. No murmur heard. Pulmonary:     Effort: Pulmonary effort is normal. No respiratory distress.     Breath sounds: Normal breath sounds.  Abdominal:     General: Bowel sounds are normal.     Palpations: Abdomen is soft.     Tenderness: There is no abdominal tenderness. There is no guarding.  Musculoskeletal:        General: Normal range of motion.     Cervical back: Neck supple.  Lymphadenopathy:     Cervical: No cervical adenopathy.  Skin:    General: Skin is warm and dry.  Neurological:     Mental  Status: She is alert and oriented to person, place, and time.  Psychiatric:        Mood and Affect: Mood normal.        Behavior: Behavior normal.        Thought Content: Thought content normal.        Judgment: Judgment normal.   BP 124/76 (BP Location: Left Arm, Patient Position: Sitting, Cuff Size: Normal)   Pulse 84  Temp 98.2 F (36.8 C) (Oral)   Resp 18   Ht 5\' 3"  (1.6 m)   Wt 132 lb 6.4 oz (60.1 kg)   SpO2 99%   BMI 23.45 kg/m  Wt Readings from Last 3 Encounters:  06/14/23 132 lb 6.4 oz (60.1 kg)  03/14/23 132 lb 6.4 oz (60.1 kg)  09/13/22 133 lb 12.8 oz (60.7 kg)    Diabetic Foot Exam - Simple   No data filed    Lab Results  Component Value Date   WBC 4.8 03/07/2023   HGB 13.1 03/07/2023   HCT 41.5 03/07/2023   PLT 243.0 03/07/2023   GLUCOSE 96 03/07/2023   CHOL 259 (H) 03/07/2023   TRIG 162.0 (H) 03/07/2023   HDL 36.10 (L) 03/07/2023   LDLCALC 190 (H) 03/07/2023   ALT 13 03/07/2023   AST 15 03/07/2023   NA 139 03/07/2023   K 4.2 03/07/2023   CL 106 03/07/2023   CREATININE 0.83 03/07/2023   BUN 9 03/07/2023   CO2 25 03/07/2023   TSH 1.57 03/07/2023   HGBA1C 5.8 03/07/2023    Lab Results  Component Value Date   TSH 1.57 03/07/2023   Lab Results  Component Value Date   WBC 4.8 03/07/2023   HGB 13.1 03/07/2023   HCT 41.5 03/07/2023   MCV 83.1 03/07/2023   PLT 243.0 03/07/2023   Lab Results  Component Value Date   NA 139 03/07/2023   K 4.2 03/07/2023   CO2 25 03/07/2023   GLUCOSE 96 03/07/2023   BUN 9 03/07/2023   CREATININE 0.83 03/07/2023   BILITOT 0.4 03/07/2023   ALKPHOS 73 03/07/2023   AST 15 03/07/2023   ALT 13 03/07/2023   PROT 7.5 03/07/2023   ALBUMIN 4.2 03/07/2023   CALCIUM 9.3 03/07/2023   ANIONGAP 9 04/18/2019   GFR 86.29 03/07/2023   Lab Results  Component Value Date   CHOL 259 (H) 03/07/2023   Lab Results  Component Value Date   HDL 36.10 (L) 03/07/2023   Lab Results  Component Value Date   LDLCALC 190 (H)  03/07/2023   Lab Results  Component Value Date   TRIG 162.0 (H) 03/07/2023   Lab Results  Component Value Date   CHOLHDL 7 03/07/2023   Lab Results  Component Value Date   HGBA1C 5.8 03/07/2023       Assessment & Plan:  Hyperglycemia Assessment & Plan: hgba1c acceptable, minimize simple carbs. Increase exercise as tolerated.  Orders: -     CBC with Differential/Platelet; Future -     TSH; Future -     Hemoglobin A1c; Future  Hyperlipidemia, mild Assessment & Plan: Encourage heart healthy diet such as MIND or DASH diet, increase exercise, avoid trans fats, simple carbohydrates and processed foods, consider a krill or fish or flaxseed oil cap daily.   Orders: -     Lipid panel; Future -     TSH; Future  Vitamin D deficiency Assessment & Plan: Supplement and monitor  Orders: -     Comprehensive metabolic panel; Future -     TSH; Future -     VITAMIN D 25 Hydroxy (Vit-D Deficiency, Fractures); Future  Allergy, initial encounter  Other orders -     Azelastine HCl; Place 1-2 sprays into both nostrils 2 (two) times daily. Use in each nostril as directed  Dispense: 30 mL; Refill: 5    Assessment and Plan    Gastrointestinal symptoms Reports of constipation and incomplete bowel movements. No alarming  symptoms such as weight loss, blood in stool, or severe abdominal pain. - Increase fluid intake to 60-80 ounces daily. - Start a fiber supplement. - Consider a probiotic supplement.  Allergic Rhinitis Reports of runny nose after eating certain foods, likely due to strong spices. No other allergic symptoms reported. - Trial of Astelin nasal spray as needed.  General Health Maintenance Up to date on COVID-19 and influenza vaccinations. - Discussed the importance of maintaining vaccinations, especially in the context of current respiratory symptoms. - Plan for blood work in February 2025. - Physical exam scheduled for April 2025.         Danise Edge, MD

## 2023-08-24 ENCOUNTER — Other Ambulatory Visit (INDEPENDENT_AMBULATORY_CARE_PROVIDER_SITE_OTHER): Payer: No Typology Code available for payment source

## 2023-08-24 ENCOUNTER — Encounter: Payer: Self-pay | Admitting: Family Medicine

## 2023-08-24 DIAGNOSIS — E559 Vitamin D deficiency, unspecified: Secondary | ICD-10-CM

## 2023-08-24 DIAGNOSIS — E785 Hyperlipidemia, unspecified: Secondary | ICD-10-CM | POA: Diagnosis not present

## 2023-08-24 DIAGNOSIS — R739 Hyperglycemia, unspecified: Secondary | ICD-10-CM | POA: Diagnosis not present

## 2023-08-24 LAB — TSH: TSH: 1.7 u[IU]/mL (ref 0.35–5.50)

## 2023-08-24 LAB — CBC WITH DIFFERENTIAL/PLATELET
Basophils Absolute: 0 10*3/uL (ref 0.0–0.1)
Basophils Relative: 0.2 % (ref 0.0–3.0)
Eosinophils Absolute: 0.1 10*3/uL (ref 0.0–0.7)
Eosinophils Relative: 1.5 % (ref 0.0–5.0)
HCT: 38.9 % (ref 36.0–46.0)
Hemoglobin: 12.4 g/dL (ref 12.0–15.0)
Lymphocytes Relative: 23.5 % (ref 12.0–46.0)
Lymphs Abs: 1.2 10*3/uL (ref 0.7–4.0)
MCHC: 31.9 g/dL (ref 30.0–36.0)
MCV: 81.1 fL (ref 78.0–100.0)
Monocytes Absolute: 0.4 10*3/uL (ref 0.1–1.0)
Monocytes Relative: 7.3 % (ref 3.0–12.0)
Neutro Abs: 3.6 10*3/uL (ref 1.4–7.7)
Neutrophils Relative %: 67.5 % (ref 43.0–77.0)
Platelets: 323 10*3/uL (ref 150.0–400.0)
RBC: 4.8 Mil/uL (ref 3.87–5.11)
RDW: 14.7 % (ref 11.5–15.5)
WBC: 5.3 10*3/uL (ref 4.0–10.5)

## 2023-08-24 LAB — COMPREHENSIVE METABOLIC PANEL
ALT: 17 U/L (ref 0–35)
AST: 15 U/L (ref 0–37)
Albumin: 4.1 g/dL (ref 3.5–5.2)
Alkaline Phosphatase: 86 U/L (ref 39–117)
BUN: 11 mg/dL (ref 6–23)
CO2: 27 meq/L (ref 19–32)
Calcium: 9.2 mg/dL (ref 8.4–10.5)
Chloride: 101 meq/L (ref 96–112)
Creatinine, Ser: 0.71 mg/dL (ref 0.40–1.20)
GFR: 103.74 mL/min (ref >60.00)
Glucose, Bld: 87 mg/dL (ref 70–99)
Potassium: 4.4 meq/L (ref 3.5–5.1)
Sodium: 139 meq/L (ref 135–145)
Total Bilirubin: 0.4 mg/dL (ref 0.2–1.2)
Total Protein: 7.5 g/dL (ref 6.0–8.3)

## 2023-08-24 LAB — LIPID PANEL
Cholesterol: 234 mg/dL — ABNORMAL HIGH (ref 0–200)
HDL: 39.4 mg/dL (ref >39.00)
LDL Cholesterol: 177 mg/dL — ABNORMAL HIGH (ref 0–99)
NonHDL: 194.5
Total CHOL/HDL Ratio: 6
Triglycerides: 89 mg/dL (ref 0.0–149.0)
VLDL: 17.8 mg/dL (ref 0.0–40.0)

## 2023-08-24 LAB — HEMOGLOBIN A1C: Hgb A1c MFr Bld: 5.9 % (ref 4.6–6.5)

## 2023-08-24 LAB — VITAMIN D 25 HYDROXY (VIT D DEFICIENCY, FRACTURES): VITD: 34.83 ng/mL (ref 30.00–100.00)

## 2023-09-20 ENCOUNTER — Encounter: Payer: No Typology Code available for payment source | Admitting: Family Medicine

## 2023-10-18 NOTE — Assessment & Plan Note (Signed)
 Encourage heart healthy diet such as MIND or DASH diet, increase exercise, avoid trans fats, simple carbohydrates and processed foods, consider a krill or fish or flaxseed oil cap daily.

## 2023-10-18 NOTE — Assessment & Plan Note (Signed)
 Encouraged moist heat and gentle stretching as tolerated. May try NSAIDs and prescription meds as directed and report if symptoms worsen or seek immediate care

## 2023-10-18 NOTE — Assessment & Plan Note (Signed)
 Supplement and monitor

## 2023-10-18 NOTE — Assessment & Plan Note (Signed)
 hgba1c acceptable, minimize simple carbs. Increase exercise as tolerated.

## 2023-10-20 ENCOUNTER — Ambulatory Visit (INDEPENDENT_AMBULATORY_CARE_PROVIDER_SITE_OTHER): Payer: Self-pay | Admitting: Family Medicine

## 2023-10-20 ENCOUNTER — Encounter: Payer: Self-pay | Admitting: Family Medicine

## 2023-10-20 VITALS — BP 130/78 | HR 90 | Temp 98.1°F | Resp 18 | Ht 63.0 in | Wt 132.6 lb

## 2023-10-20 DIAGNOSIS — E785 Hyperlipidemia, unspecified: Secondary | ICD-10-CM | POA: Diagnosis not present

## 2023-10-20 DIAGNOSIS — R739 Hyperglycemia, unspecified: Secondary | ICD-10-CM

## 2023-10-20 DIAGNOSIS — M549 Dorsalgia, unspecified: Secondary | ICD-10-CM

## 2023-10-20 DIAGNOSIS — E559 Vitamin D deficiency, unspecified: Secondary | ICD-10-CM

## 2023-10-20 NOTE — Patient Instructions (Addendum)
 Skratch beverage powder to replace electrolytes and might help lightheadedness. Can find at [employer] 10 ounces of water every 1-2 hours  Protein every 3-4 hours

## 2023-10-21 NOTE — Progress Notes (Signed)
 Subjective:    Patient ID: Tara Stevenson, female    DOB: September 20, 1980, 43 y.o.   MRN: [id number]  Chief Complaint  Patient presents with  . Follow-up    HPI Discussed the use of AI scribe software for clinical note transcription with the patient, who gave verbal consent to proceed.  History of Present Illness Tara Stevenson is a 43 year old female who presents with lightheadedness and sweating episodes.  She experiences episodes of lightheadedness characterized by sensations of things getting darker, pressure on her eyes, and occasional leg weakness. These symptoms have been ongoing for some time, and an EKG was previously performed to rule out cardiac issues. No palpitations, chest pain, or dyspnea occur during these episodes.  She experiences increased sweating, particularly nocturnally, waking up feeling sweaty despite going to bed feeling cold. She initially attributed this to menopause, as her mother experienced menopause at a similar age. However, her menstrual cycles remain regular due to birth control use. She also reports hair thinning, skin texture changes, and fatigue, which she associates with hormonal changes.  She discusses her hydration and dietary habits, noting frequent urination despite efforts to stay hydrated. She mentions fasting during Ramadan, which she feels may have contributed to her symptoms. She tries to maintain a diet with reduced carbohydrates and increased protein intake.  She reports a recent issue with her left eye, experiencing pressure and blurriness. An eye doctor attributed the worsened vision in her left eye to age-related changes. She expresses concern about these changes occurring earlier than expected, possibly due to family history of early menopause.    Past Medical History:  Diagnosis Date  . Allergy   . Anemia   . Cervical radiculitis   . Heart murmur   . Hx of typhoid fever   . Hyperglycemia 04/15/2016  . Hyperlipidemia, mild 04/15/2016   . Left shoulder pain 04/15/2016  . Lightheadedness 11/04/2016  . Lumbar spine pain   . Tachycardia 11/04/2016  . Thoracic spine pain     Past Surgical History:  Procedure Laterality Date  . CESAREAN SECTION     2005  . HERNIA REPAIR     2007    Family History  Problem Relation Age of Onset  . Cancer Mother 1       uterine cancer with ovarian cyst  . Hypertension Mother   . Osteoarthritis Mother   . Arthritis Father   . Hypertension Father   . Hyperlipidemia Father   . Diabetes Father   . Hypertension Maternal Grandmother   . Arthritis Maternal Grandmother   . Diabetes Maternal Grandfather   . Stroke Maternal Grandfather   . Hypertension Maternal Grandfather   . Arthritis Paternal Grandmother   . Hypertension Paternal Grandmother   . Diabetes Paternal Grandmother   . Cerebrovascular Accident Paternal Grandmother   . Hypertension Paternal Grandfather   . Cerebrovascular Accident Paternal Grandfather   . Breast cancer Neg Hx     Social History   Socioeconomic History  . Marital status: Married    Spouse name: Not on file  . Number of children: 2  . Years of education: BS degree  . Highest education level: Bachelor's degree (e.g., BA, AB, BS)  Occupational History  . Occupation: Self employeed  Tobacco Use  . Smoking status: Never  . Smokeless tobacco: Never  Substance and Sexual Activity  . Alcohol use: No    Alcohol/week: 0.0 standard drinks of alcohol  . Drug use: No  . Sexual activity:  Yes    Birth control/protection: None  Other Topics Concern  . 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 Difficulty of Paying Living Expenses: Not hard at all  Food Insecurity: No Food Insecurity (10/13/2023)   Hunger Vital Sign   . Worried About Programme researcher, broadcasting/film/video in the Last Year: Never true   . Ran Out of Food in the Last Year: Never true  Transportation Needs: No Transportation Needs (10/13/2023)   PRAPARE - Transportation   . Lack of Transportation (Medical): No   . Lack of Transportation (Non-Medical): No  Physical Activity: Unknown (10/13/2023)   Exercise Vital Sign   . Days of Exercise per Week: Patient declined   . Minutes of Exercise per Session: Not on file  Stress: No Stress Concern Present (10/13/2023)   [employer] of Occupational Health - Occupational Stress Questionnaire   . Feeling of Stress : Not at all  Social Connections: Moderately Integrated (10/13/2023)   Social Connection and Isolation Panel [NHANES]   . Frequency of Communication with Friends and Family: More than three times a week   . Frequency of Social Gatherings with Friends and Family: More than three times a week   . Attends Religious Services: More than 4 times per year   . Active Member of Clubs or Organizations: No   . Attends Banker Meetings: Not on file   . Marital Status: Married  Catering manager Violence: Not on file    Outpatient Medications Prior to Visit  Medication Sig Dispense Refill  . KURVELO 0.15-30 MG-MCG tablet Take 1 tablet by mouth daily.    Marland Kitchen azelastine (ASTELIN) 0.1 % nasal spray Place 1-2 sprays into both nostrils 2 (two) times daily. Use in each nostril as directed 30 mL 5  . Cholecalciferol (VITAMIN D) 125 MCG (5000 UT) CAPS Take 1 capsule by mouth daily.    . Ferrous Sulfate (IRON PO) Take by mouth.    . meloxicam (MOBIC) 7.5 MG tablet Take 1 tablet (7.5 mg total) by mouth daily. 30 tablet 0  . Vitamin D, Ergocalciferol, (DRISDOL) 1.25 MG (50000 UNIT) CAPS capsule TAKE 1 CAPSULE BY MOUTH EVERY 7 DAYS 4 capsule 0   No facility-administered  medications prior to visit.    Allergies  Allergen Reactions  . Flonase [Fluticasone Propionate]     Anxious, restless    Review of Systems  Constitutional:  Negative for fever and malaise/fatigue.  HENT:  Negative for congestion.   Eyes:  Positive for blurred vision.  Respiratory:  Negative for shortness of breath.   Cardiovascular:  Negative for chest pain, palpitations and leg swelling.  Gastrointestinal:  Negative for abdominal pain, blood in stool and nausea.  Genitourinary:  Negative for dysuria and frequency.  Musculoskeletal:  Negative for falls.  Skin:  Negative for rash.  Neurological:  Positive for dizziness. Negative for loss of consciousness and headaches.  Endo/Heme/Allergies:  Negative for environmental allergies.  Psychiatric/Behavioral:  Negative for depression.  The patient is not nervous/anxious.       Objective:    Physical Exam Constitutional:      General: She is not in acute distress.    Appearance: Normal appearance. She is well-developed. She is not toxic-appearing.  HENT:     Head: Normocephalic and atraumatic.     Right Ear: External ear normal.     Left Ear: External ear normal.     Nose: Nose normal.  Eyes:     General:        Right eye: No discharge.        Left eye: No discharge.     Conjunctiva/sclera: Conjunctivae normal.  Neck:     Thyroid: No thyromegaly.  Cardiovascular:     Rate and Rhythm: Normal rate and regular rhythm.     Heart sounds: Normal heart sounds. No murmur heard. Pulmonary:     Effort: Pulmonary effort is normal. No respiratory distress.     Breath sounds: Normal breath sounds.  Abdominal:     General: Bowel sounds are normal.     Palpations: Abdomen is soft.     Tenderness: There is no abdominal tenderness. There is no guarding.  Musculoskeletal:        General: Normal range of motion.     Cervical back: Neck supple.  Lymphadenopathy:     Cervical: No cervical adenopathy.  Skin:    General: Skin is warm and  dry.  Neurological:     Mental Status: She is alert and oriented to person, place, and time.  Psychiatric:        Mood and Affect: Mood normal.        Behavior: Behavior normal.        Thought Content: Thought content normal.        Judgment: Judgment normal.   BP 130/78 (BP Location: Left Arm, Patient Position: Sitting, Cuff Size: Normal)   Pulse 90   Temp 98.1 F (36.7 C) (Oral)   Resp 18   Ht 5\' 3"  (1.6 m)   Wt 132 lb 9.6 oz (60.1 kg)   SpO2 98%   BMI 23.49 kg/m  Wt Readings from Last 3 Encounters:  10/20/23 132 lb 9.6 oz (60.1 kg)  06/14/23 132 lb 6.4 oz (60.1 kg)  03/14/23 132 lb 6.4 oz (60.1 kg)    Diabetic Foot Exam - Simple   No data filed    Lab Results  Component Value Date   WBC 5.3 08/24/2023   HGB 12.4 08/24/2023   HCT 38.9 08/24/2023   PLT 323.0 08/24/2023   GLUCOSE 87 08/24/2023   CHOL 234 (H) 08/24/2023   TRIG 89.0 08/24/2023   HDL 39.40 08/24/2023   LDLCALC 177 (H) 08/24/2023   ALT 17 08/24/2023   AST 15 08/24/2023   NA 139 08/24/2023   K 4.4 08/24/2023   CL 101 08/24/2023   CREATININE 0.71 08/24/2023   BUN 11 08/24/2023   CO2 27 08/24/2023   TSH 1.70 08/24/2023   HGBA1C 5.9 08/24/2023    Lab Results  Component Value Date   TSH 1.70 08/24/2023   Lab Results  Component Value Date   WBC 5.3 08/24/2023   HGB 12.4 08/24/2023   HCT 38.9 08/24/2023   MCV 81.1 08/24/2023   PLT 323.0 08/24/2023   Lab Results  Component Value Date   NA 139 08/24/2023   K 4.4 08/24/2023   CO2 27 08/24/2023   GLUCOSE 87 08/24/2023   BUN 11 08/24/2023   CREATININE  0.71 08/24/2023   BILITOT 0.4 08/24/2023   ALKPHOS 86 08/24/2023   AST 15 08/24/2023   ALT 17 08/24/2023   PROT 7.5 08/24/2023   ALBUMIN 4.1 08/24/2023   CALCIUM 9.2 08/24/2023   ANIONGAP 9 04/18/2019   GFR 103.74 08/24/2023   Lab Results  Component Value Date   CHOL 234 (H) 08/24/2023   Lab Results  Component Value Date   HDL 39.40 08/24/2023   Lab Results  Component Value Date    LDLCALC 177 (H) 08/24/2023   Lab Results  Component Value Date   TRIG 89.0 08/24/2023   Lab Results  Component Value Date   CHOLHDL 6 08/24/2023   Lab Results  Component Value Date   HGBA1C 5.9 08/24/2023       Assessment & Plan:  Vitamin D deficiency Assessment & Plan: Supplement and monitor    Mid back pain Assessment & Plan: Encouraged moist heat and gentle stretching as tolerated. May try NSAIDs and prescription meds as directed and report if symptoms worsen or seek immediate care    Hyperglycemia Assessment & Plan: hgba1c acceptable, minimize simple carbs. Increase exercise as tolerated.   Hyperlipidemia, mild Assessment & Plan: Encourage heart healthy diet such as MIND or DASH diet, increase exercise, avoid trans fats, simple carbohydrates and processed foods, consider a krill or fish or flaxseed oil cap daily.      Assessment and Plan Assessment & Plan Lightheadedness Intermittent episodes without syncope, exacerbated by dehydration and dietary habits, especially during Ramadan fasting. Emphasized regular hydration and protein intake. - Encourage hydration with ten ounces of water every one to two hours. - Advise protein intake every three to four hours.  Perimenopause Symptoms include hyperhidrosis, hair thinning, skin changes, and fatigue. Birth control may regulate cycles but not alleviate symptoms. Discussed accelerated aging and lifestyle modifications. - Focus on dietary changes: increase protein intake and reduce carbohydrates. - Encourage regular hydration and rest. - Consider lifestyle modifications to manage symptoms.  Gastrointestinal symptoms Increased flatulence with potential gastrointestinal issues. Discussed fiber, probiotics, and colonoscopy for early detection and prevention of cancer. - Consider starting fiber supplements and probiotics. - If symptoms persist, consider referral to a gastroenterologist for further evaluation,  including a possible colonoscopy.  Follow-up Emphasized importance of regular screenings for early detection. - Schedule mammogram as per OBGYN's recommendation. - Consider scheduling a colonoscopy, especially if gastrointestinal symptoms persist. - Follow up in June for further evaluation if needed.     Danise Edge, MD

## 2024-01-15 NOTE — Assessment & Plan Note (Signed)
Patient encouraged to maintain heart healthy diet, regular exercise, adequate sleep. Consider daily probiotics. Take medications as prescribed. Labs ordered and reviewed. Follows with [facility], reports she is up todate

## 2024-01-15 NOTE — Assessment & Plan Note (Signed)
 hgba1c acceptable, minimize simple carbs. Increase exercise as tolerated.

## 2024-01-15 NOTE — Assessment & Plan Note (Signed)
 Encourage heart healthy diet such as MIND or DASH diet, increase exercise, avoid trans fats, simple carbohydrates and processed foods, consider a krill or fish or flaxseed oil cap daily.

## 2024-01-15 NOTE — Assessment & Plan Note (Signed)
 Supplement and monitor

## 2024-01-16 ENCOUNTER — Ambulatory Visit (INDEPENDENT_AMBULATORY_CARE_PROVIDER_SITE_OTHER): Payer: Self-pay | Admitting: Family Medicine

## 2024-01-16 ENCOUNTER — Encounter: Payer: Self-pay | Admitting: Family Medicine

## 2024-01-16 VITALS — BP 124/80 | HR 85 | Resp 16 | Ht 63.0 in | Wt 135.8 lb

## 2024-01-16 DIAGNOSIS — Z0000 Encounter for general adult medical examination without abnormal findings: Secondary | ICD-10-CM | POA: Diagnosis not present

## 2024-01-16 DIAGNOSIS — E785 Hyperlipidemia, unspecified: Secondary | ICD-10-CM

## 2024-01-16 DIAGNOSIS — E559 Vitamin D deficiency, unspecified: Secondary | ICD-10-CM

## 2024-01-16 DIAGNOSIS — D508 Other iron deficiency anemias: Secondary | ICD-10-CM

## 2024-01-16 DIAGNOSIS — R739 Hyperglycemia, unspecified: Secondary | ICD-10-CM

## 2024-01-16 NOTE — Addendum Note (Signed)
 Addended by: TRUDY CURVIN RAMAN on: 01/16/2024 11:43 AM   Modules accepted: Orders

## 2024-01-16 NOTE — Progress Notes (Signed)
 Subjective:    Patient ID: Tara Stevenson, female    DOB: [birth year], 43 y.o.   MRN: [id number]  Chief Complaint  Patient presents with  . Annual Exam    Patient presents today for a physical exam.  . Quality Metric Gaps    Pap, mammogram, Hep B and HPV    HPI Discussed the use of AI scribe software for clinical note transcription with the patient, who gave verbal consent to proceed.  History of Present Illness Tara Stevenson is a 43 year old female who presents for a routine follow-up visit.  She experiences dizziness, which she attributes to irregular eating habits. The dizziness improves with food intake, and she feels 'weird' when she doesn't eat, indicating a need for regular meals to maintain energy levels.  She has occasional blurry vision, which she associates with not eating. She describes the vision changes as 'a little darker' and believes they occur when her blood sugar levels drop.  She reports frequent urination without burning or blood. She experiences some gas and has started taking Metamucil gummies with probiotics to aid digestion. She is gradually increasing her fiber intake to avoid gastrointestinal discomfort.  She experiences foot pain, which has led her to sit more than walk. She has tried different shoes, including [employer], which she finds comfortable. She received a shot for her foot pain once but is hesitant to get another.  She has a history of an incisional hernia and reports some discomfort in the lower abdomen. She wonders if this might be related to her hernia or bladder, although she frequently urinates and does not feel her bladder is full.  She is concerned about her weight, noting that she has gained weight around her stomach, which is a change from her previous body composition. She attributes this to changes in her activity level and possibly hormonal changes as she ages.    Past Medical History:  Diagnosis Date  . Allergy   . Anemia   .  Cervical radiculitis   . Heart murmur   . Hx of typhoid fever   . Hyperglycemia 04/15/2016  . Hyperlipidemia, mild 04/15/2016  . Left shoulder pain 04/15/2016  . Lightheadedness 11/04/2016  . Lumbar spine pain   . Tachycardia 11/04/2016  . Thoracic spine pain     Past Surgical History:  Procedure Laterality Date  . CESAREAN SECTION     2005  . HERNIA REPAIR     2007    Family History  Problem Relation Age of Onset  . Cancer Mother 20       uterine cancer with ovarian cyst  . Hypertension Mother   . Osteoarthritis Mother   . Arthritis Father   . Hypertension Father   . Hyperlipidemia Father   . Diabetes Father   . Other Father        post herpetic neuralgia  . Hypertension Maternal Grandmother   . Arthritis Maternal Grandmother   . Diabetes Maternal Grandfather   . Stroke Maternal Grandfather   . Hypertension Maternal Grandfather   . Arthritis Paternal Grandmother   . Hypertension Paternal Grandmother   . Diabetes Paternal Grandmother   . Cerebrovascular Accident Paternal Grandmother   . Hypertension Paternal Grandfather   . Cerebrovascular Accident Paternal Grandfather   . Breast cancer Neg Hx     Social History   Socioeconomic History  . Marital status: Married    Spouse name: Not on file  . Number of children: 2  . Years  of education: BS degree  . Highest education level: Bachelor's degree (e.g., BA, AB, BS)  Occupational History  . Occupation: Self employeed  Tobacco Use  . Smoking status: Never  . Smokeless tobacco: Never  Substance and Sexual Activity  . Alcohol use: No    Alcohol/week: 0.0 standard drinks of alcohol  . Drug use: No  . Sexual activity: Yes    Birth control/protection: None  Other Topics Concern  . 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 Difficulty of Paying Living Expenses: Not hard at all  Food Insecurity: No Food Insecurity (10/13/2023)   Hunger Vital Sign   . Worried About Programme researcher, broadcasting/film/video in the Last Year: Never true   . Ran Out of Food in the Last Year: Never true  Transportation Needs: No Transportation Needs (10/13/2023)   PRAPARE - Transportation   . Lack of Transportation (Medical): No   . Lack of Transportation (Non-Medical): No  Physical Activity: Unknown (10/13/2023)   Exercise Vital Sign   . Days of Exercise per Week: Patient declined   . Minutes of Exercise per Session: Not on file  Stress: No Stress Concern Present (10/13/2023)   [employer] of Occupational Health - Occupational Stress Questionnaire   . Feeling of Stress : Not at all  Social Connections: Moderately Integrated (10/13/2023)   Social Connection and Isolation Panel   . Frequency of Communication with Friends and Family: More than three times a week   . Frequency of Social Gatherings with Friends and Family: More than three times a week   . Attends Religious Services: More than 4 times per year   . Active Member of Clubs or Organizations: No   . Attends Banker Meetings: Not on file   . Marital Status: Married  Catering manager Violence: Not on file    Outpatient Medications Prior to Visit  Medication Sig Dispense Refill  . KURVELO 0.15-30 MG-MCG tablet Take 1 tablet by mouth daily.     No facility-administered medications prior to visit.    Allergies  Allergen Reactions  . Flonase [Fluticasone Propionate]     Anxious, restless    Review of Systems  Constitutional:  Positive for malaise/fatigue. Negative for chills and fever.  HENT:  Negative for congestion and hearing loss.   Eyes:  Positive for blurred vision.  Negative for double vision, photophobia and discharge.  Respiratory:  Negative for cough, sputum production and shortness of breath.   Cardiovascular:  Negative for chest pain, palpitations and leg swelling.  Gastrointestinal:  Positive for abdominal pain. Negative for blood in stool, constipation, diarrhea, heartburn, nausea and vomiting.  Genitourinary:  Negative for dysuria, frequency, hematuria and urgency.  Musculoskeletal:  Negative for back pain, falls and myalgias.  Skin:  Negative for rash.  Neurological:  Positive for dizziness. Negative for sensory change, loss of consciousness, weakness and headaches.  Endo/Heme/Allergies:  Negative for environmental allergies. Does not bruise/bleed easily.  Psychiatric/Behavioral:  Negative for  depression and suicidal ideas. The patient is not nervous/anxious and does not have insomnia.        Objective:    Physical Exam Constitutional:      General: She is not in acute distress.    Appearance: Normal appearance. She is not diaphoretic.  HENT:     Head: Normocephalic and atraumatic.     Right Ear: Tympanic membrane, ear canal and external ear normal.     Left Ear: Tympanic membrane, ear canal and external ear normal.     Nose: Nose normal.     Mouth/Throat:     Mouth: Mucous membranes are moist.     Pharynx: Oropharynx is clear. No oropharyngeal exudate.   Eyes:     General: No scleral icterus.       Right eye: No discharge.        Left eye: No discharge.     Conjunctiva/sclera: Conjunctivae normal.     Pupils: Pupils are equal, round, and reactive to light.   Neck:     Thyroid : No thyromegaly.   Cardiovascular:     Rate and Rhythm: Normal rate and regular rhythm.     Heart sounds: Normal heart sounds. No murmur heard. Pulmonary:     Effort: Pulmonary effort is normal. No respiratory distress.     Breath sounds: Normal breath sounds. No wheezing or rales.  Abdominal:     General: Bowel sounds are normal. There is no  distension.     Palpations: Abdomen is soft. There is no mass.     Tenderness: There is no abdominal tenderness.   Musculoskeletal:        General: No tenderness. Normal range of motion.     Cervical back: Normal range of motion and neck supple.  Lymphadenopathy:     Cervical: No cervical adenopathy.   Skin:    General: Skin is warm and dry.     Findings: No rash.   Neurological:     General: No focal deficit present.     Mental Status: She is alert and oriented to person, place, and time.     Cranial Nerves: No cranial nerve deficit.     Coordination: Coordination normal.     Deep Tendon Reflexes: Reflexes are normal and symmetric. Reflexes normal.   Psychiatric:        Mood and Affect: Mood normal.        Behavior: Behavior normal.        Thought Content: Thought content normal.        Judgment: Judgment normal.    BP 124/80   Pulse 85   Resp 16   Ht 5' 3 (1.6 m)   Wt 135 lb 12.8 oz (61.6 kg)   LMP 01/10/2024 (Exact Date)   SpO2 98%   BMI 24.06 kg/m  Wt Readings from Last 3 Encounters:  01/16/24 135 lb 12.8 oz (61.6 kg)  10/20/23 132 lb 9.6 oz (60.1 kg)  06/14/23 132 lb 6.4 oz (60.1 kg)    Diabetic Foot Exam - Simple   No data filed    Lab Results  Component Value Date   WBC 5.3 08/24/2023   HGB 12.4 08/24/2023   HCT 38.9 08/24/2023   PLT 323.0 08/24/2023   GLUCOSE 87 08/24/2023   CHOL 234 (H) 08/24/2023   TRIG 89.0 08/24/2023   HDL 39.40 08/24/2023   LDLCALC 177 (H) 08/24/2023   ALT 17 08/24/2023   AST 15 08/24/2023   NA 139 08/24/2023   K  4.4 08/24/2023   CL 101 08/24/2023   CREATININE 0.71 08/24/2023   BUN 11 08/24/2023   CO2 27 08/24/2023   TSH 1.70 08/24/2023   HGBA1C 5.9 08/24/2023    Lab Results  Component Value Date   TSH 1.70 08/24/2023   Lab Results  Component Value Date   WBC 5.3 08/24/2023   HGB 12.4 08/24/2023   HCT 38.9 08/24/2023   MCV 81.1 08/24/2023   PLT 323.0 08/24/2023   Lab Results  Component Value Date   NA  139 08/24/2023   K 4.4 08/24/2023   CO2 27 08/24/2023   GLUCOSE 87 08/24/2023   BUN 11 08/24/2023   CREATININE 0.71 08/24/2023   BILITOT 0.4 08/24/2023   ALKPHOS 86 08/24/2023   AST 15 08/24/2023   ALT 17 08/24/2023   PROT 7.5 08/24/2023   ALBUMIN 4.1 08/24/2023   CALCIUM 9.2 08/24/2023   ANIONGAP 9 04/18/2019   GFR 103.74 08/24/2023   Lab Results  Component Value Date   CHOL 234 (H) 08/24/2023   Lab Results  Component Value Date   HDL 39.40 08/24/2023   Lab Results  Component Value Date   LDLCALC 177 (H) 08/24/2023   Lab Results  Component Value Date   TRIG 89.0 08/24/2023   Lab Results  Component Value Date   CHOLHDL 6 08/24/2023   Lab Results  Component Value Date   HGBA1C 5.9 08/24/2023       Assessment & Plan:  Hyperglycemia Assessment & Plan: hgba1c acceptable, minimize simple carbs. Increase exercise as tolerated.  Orders: -     Hemoglobin A1c -     Vitamin B12  Hyperlipidemia, mild Assessment & Plan: Encourage heart healthy diet such as MIND or DASH diet, increase exercise, avoid trans fats, simple carbohydrates and processed foods, consider a krill or fish or flaxseed oil cap daily.   Orders: -     CBC with Differential/Platelet -     Comprehensive metabolic panel with GFR -     Lipid panel -     Vitamin B12  Preventative health care Assessment & Plan: Patient encouraged to maintain heart healthy diet, regular exercise, adequate sleep. Consider daily probiotics. Take medications as prescribed. Labs ordered and reviewed. Follows with [facility], reports she is up to date. Screening colonoscopy starts at 44 yo.   Vitamin D  deficiency Assessment & Plan: Supplement and monitor   Orders: -     VITAMIN D  25 Hydroxy (Vit-D Deficiency, Fractures)  Other iron deficiency anemia Assessment & Plan: Supplement and monitor check labs, check vitamin B12 as well  Orders: -     Vitamin B12    Assessment and Plan Assessment &  Plan Lightheadedness Intermittent episodes likely due to dietary habits and hypoglycemia from inadequate food intake. - Advise protein intake every 3-4 hours to stabilize blood sugar. - Encourage hydration with at least 80 ounces of water daily.  Foot pain Chronic pain previously treated with corticosteroid injection. Considering physical therapy. - Recommend arranging physical therapy for pain management.  General Health Maintenance Up to date on tetanus. Advised on mammogram and colonoscopy schedules. Emphasized regular screenings and vaccinations for early detection. - Obtain previous mammogram copy. - Advise mammograms every 2 years in 40s, every 12-18 months in 65s. - Discuss colonoscopy starting at age 22. - Continue annual flu and COVID vaccinations.  Goals of Care Emphasized importance of advanced directives to ensure medical preferences are respected. - Provide information on advanced directives, healthcare power of attorney, and  living will. - Encourage family discussion and documentation of medical preferences.  Follow-up Routine follow-up to monitor health conditions and adherence to recommendations. - Schedule follow-up in 6 months. - Conduct blood work to monitor health status.     Harlene Horton, MD

## 2024-01-16 NOTE — Assessment & Plan Note (Addendum)
 Supplement and monitor check labs, check vitamin B12 as well

## 2024-01-16 NOTE — Patient Instructions (Addendum)
 Tetanus vaccine in 2029 or sooner if injured  Apple pectin and oat bran fiber. [email or web address]  Preventive Care 20-44 Years Old, Female Preventive care refers to lifestyle choices and visits with your health care provider that can promote health and wellness.   Preventive care visits are also called wellness exams. What can I expect for my preventive care visit? Counseling During your preventive care visit, your health care provider may ask about your: Medical history, including: Past medical problems. Family medical history. Pregnancy history. Current health, including: Menstrual cycle. Method of birth control. Emotional well-being. Home life and relationship well-being. Sexual activity and sexual health. Lifestyle, including: Alcohol, nicotine or tobacco, and drug use. Access to firearms. Diet, exercise, and sleep habits. Work and work Astronomer. Sunscreen use. Safety issues such as seatbelt and bike helmet use. Physical exam Your health care provider may check your: Height and weight. These may be used to calculate your BMI (body mass index). BMI is a measurement that tells if you are at a healthy weight. Waist circumference. This measures the distance around your waistline. This measurement also tells if you are at a healthy weight and may help predict your risk of certain diseases, such as type 2 diabetes and high blood pressure. Heart rate and blood pressure. Body temperature. Skin for abnormal spots. What immunizations do I need?  Vaccines are usually given at various ages, according to a schedule. Your health care provider will recommend vaccines for you based on your age, medical history, and lifestyle or other factors, such as travel or where you work. What tests do I need? Screening Your health care provider may recommend screening tests for certain conditions. This may include: Pelvic exam and Pap test. Lipid and cholesterol levels. Diabetes screening. This is  done by checking your blood sugar (glucose) after you have not eaten for a while (fasting). Hepatitis B test. Hepatitis C test. HIV (human immunodeficiency virus) test. STI (sexually transmitted infection) testing, if you are at risk. BRCA-related cancer screening. This may be done if you have a family history of breast, ovarian, tubal, or peritoneal cancers. Talk with your health care provider about your test results, treatment options, and if necessary, the need for more tests. Follow these instructions at home: Eating and drinking  Eat a healthy diet that includes fresh fruits and vegetables, whole grains, lean protein, and low-fat dairy products. Take vitamin and mineral supplements as recommended by your health care provider. Do not drink alcohol if: Your health care provider tells you not to drink. You are pregnant, may be pregnant, or are planning to become pregnant. If you drink alcohol: Limit how much you have to 0-1 drink a day. Know how much alcohol is in your drink. In the [place] one drink equals one 12 oz bottle of beer (355 mL), one 5 oz glass of wine (148 mL), or one 1 oz glass of hard liquor (44 mL). Lifestyle Brush your teeth every morning and night with fluoride toothpaste. Floss one time each day. Exercise for at least 30 minutes 5 or more days each week. Do not use any products that contain nicotine or tobacco. These products include cigarettes, chewing tobacco, and vaping devices, such as e-cigarettes. If you need help quitting, ask your health care provider. Do not use drugs. If you are sexually active, practice safe sex. Use a condom or other form of protection to prevent STIs. If you do not wish to become pregnant, use a form of birth control. If you plan to become  pregnant, see your health care provider for a prepregnancy visit. Find healthy ways to manage stress, such as: Meditation, yoga, or listening to music. Journaling. Talking to a trusted person. Spending  time with friends and family. Minimize exposure to UV radiation to reduce your risk of skin cancer. Safety Always wear your seat belt while driving or riding in a vehicle. Do not drive: If you have been drinking alcohol. Do not ride with someone who has been drinking. If you have been using any mind-altering substances or drugs. While texting. When you are tired or distracted. Wear a helmet and other protective equipment during sports activities. If you have firearms in your house, make sure you follow all gun safety procedures. Seek help if you have been physically or sexually abused. What's next? Go to your health care provider once a year for an annual wellness visit. Ask your health care provider how often you should have your eyes and teeth checked. Stay up to date on all vaccines. This information is not intended to replace advice given to you by your health care provider. Make sure you discuss any questions you have with your health care provider. Document Revised: 12/31/2020 Document Reviewed: 12/31/2020 Elsevier Patient Education  2024 [employer].

## 2024-01-18 ENCOUNTER — Other Ambulatory Visit (INDEPENDENT_AMBULATORY_CARE_PROVIDER_SITE_OTHER)

## 2024-01-18 DIAGNOSIS — R739 Hyperglycemia, unspecified: Secondary | ICD-10-CM

## 2024-01-18 DIAGNOSIS — E785 Hyperlipidemia, unspecified: Secondary | ICD-10-CM | POA: Diagnosis not present

## 2024-01-18 DIAGNOSIS — D508 Other iron deficiency anemias: Secondary | ICD-10-CM | POA: Diagnosis not present

## 2024-01-18 DIAGNOSIS — E559 Vitamin D deficiency, unspecified: Secondary | ICD-10-CM

## 2024-01-18 LAB — COMPREHENSIVE METABOLIC PANEL WITH GFR
ALT: 27 U/L (ref 0–35)
AST: 23 U/L (ref 0–37)
Albumin: 4.4 g/dL (ref 3.5–5.2)
Alkaline Phosphatase: 96 U/L (ref 39–117)
BUN: 12 mg/dL (ref 6–23)
CO2: 27 meq/L (ref 19–32)
Calcium: 9.5 mg/dL (ref 8.4–10.5)
Chloride: 102 meq/L (ref 96–112)
Creatinine, Ser: 0.85 mg/dL (ref 0.40–1.20)
GFR: 83.35 mL/min (ref >60.00)
Glucose, Bld: 93 mg/dL (ref 70–99)
Potassium: 4.2 meq/L (ref 3.5–5.1)
Sodium: 138 meq/L (ref 135–145)
Total Bilirubin: 0.4 mg/dL (ref 0.2–1.2)
Total Protein: 7.8 g/dL (ref 6.0–8.3)

## 2024-01-18 LAB — LIPID PANEL
Cholesterol: 264 mg/dL — ABNORMAL HIGH (ref 0–200)
HDL: 48.4 mg/dL (ref >39.00)
LDL Cholesterol: 192 mg/dL — ABNORMAL HIGH (ref 0–99)
NonHDL: 215.26
Total CHOL/HDL Ratio: 5
Triglycerides: 116 mg/dL (ref 0.0–149.0)
VLDL: 23.2 mg/dL (ref 0.0–40.0)

## 2024-01-18 LAB — CBC WITH DIFFERENTIAL/PLATELET
Basophils Absolute: 0 10*3/uL (ref 0.0–0.1)
Basophils Relative: 0.6 % (ref 0.0–3.0)
Eosinophils Absolute: 0.5 10*3/uL (ref 0.0–0.7)
Eosinophils Relative: 7.9 % — ABNORMAL HIGH (ref 0.0–5.0)
HCT: 39.3 % (ref 36.0–46.0)
Hemoglobin: 12.4 g/dL (ref 12.0–15.0)
Lymphocytes Relative: 31.2 % (ref 12.0–46.0)
Lymphs Abs: 1.8 10*3/uL (ref 0.7–4.0)
MCHC: 31.5 g/dL (ref 30.0–36.0)
MCV: 77.3 fl — ABNORMAL LOW (ref 78.0–100.0)
Monocytes Absolute: 0.3 10*3/uL (ref 0.1–1.0)
Monocytes Relative: 5.7 % (ref 3.0–12.0)
Neutro Abs: 3.2 10*3/uL (ref 1.4–7.7)
Neutrophils Relative %: 54.6 % (ref 43.0–77.0)
Platelets: 328 10*3/uL (ref 150.0–400.0)
RBC: 5.09 Mil/uL (ref 3.87–5.11)
RDW: 15.9 % — ABNORMAL HIGH (ref 11.5–15.5)
WBC: 5.8 10*3/uL (ref 4.0–10.5)

## 2024-01-18 LAB — VITAMIN D 25 HYDROXY (VIT D DEFICIENCY, FRACTURES): VITD: 33.63 ng/mL (ref 30.00–100.00)

## 2024-01-18 LAB — HEMOGLOBIN A1C: Hgb A1c MFr Bld: 6 % (ref 4.6–6.5)

## 2024-01-18 LAB — VITAMIN B12: Vitamin B-12: 330 pg/mL (ref 211–911)

## 2024-01-19 ENCOUNTER — Ambulatory Visit: Payer: Self-pay | Admitting: Family Medicine

## 2024-06-01 ENCOUNTER — Ambulatory Visit (INDEPENDENT_AMBULATORY_CARE_PROVIDER_SITE_OTHER): Admitting: Family Medicine

## 2024-06-01 ENCOUNTER — Ambulatory Visit: Payer: Self-pay

## 2024-06-01 ENCOUNTER — Encounter: Payer: Self-pay | Admitting: Family Medicine

## 2024-06-01 VITALS — BP 134/85 | HR 84 | Temp 96.3°F | Wt 137.6 lb

## 2024-06-01 DIAGNOSIS — J069 Acute upper respiratory infection, unspecified: Secondary | ICD-10-CM | POA: Diagnosis not present

## 2024-06-01 LAB — POCT INFLUENZA A/B
Influenza A, POC: NEGATIVE
Influenza B, POC: NEGATIVE

## 2024-06-01 LAB — POCT RAPID STREP A (OFFICE): Rapid Strep A Screen: NEGATIVE

## 2024-06-01 MED ORDER — DOXYCYCLINE HYCLATE 100 MG PO CAPS: 100.0000 mg | ORAL_CAPSULE | Freq: Two times a day (BID) | ORAL | 0 refills | Status: AC

## 2024-06-01 NOTE — Progress Notes (Signed)
 OFFICE VISIT  06/01/2024  CC:  Chief Complaint  Patient presents with   Sore Throat    Pt also c/o runny nose, coughing  up green mucus starting 6 days  ago, has difficulty swallowing at night, fatigue, body aches. Denies n/v/d. Has been taking Claritin    Patient is a 43 y.o. female who presents for sore throat.  HPI: Onset 6 days ago, nasal congestion/runny nose, postnasal drip, sore throat.  She has developed some bodyaches and fatigue and cough.  No fever, shortness of breath, chest tightness, or wheezing.  No nausea, vomiting, diarrhea, or rash.  Has tried some Tylenol but not in the last couple of days.  Past Medical History:  Diagnosis Date   Allergy    Anemia    Cervical radiculitis    Heart murmur    Hx of typhoid fever    Hyperglycemia 04/15/2016   Hyperlipidemia, mild 04/15/2016   Left shoulder pain 04/15/2016   Lightheadedness 11/04/2016   Lumbar spine pain    Tachycardia 11/04/2016   Thoracic spine pain     Past Surgical History:  Procedure Laterality Date   CESAREAN SECTION     2005   HERNIA REPAIR     2007    Outpatient Medications Prior to Visit  Medication Sig Dispense Refill   KURVELO 0.15-30 MG-MCG tablet Take 1 tablet by mouth daily.     No facility-administered medications prior to visit.    Allergies  Allergen Reactions   Flonase [Fluticasone Propionate]     Anxious, restless    Review of Systems  As per HPI  PE:    06/01/2024    4:09 PM 06/01/2024    4:08 PM 01/16/2024   10:08 AM  Vitals with BMI  Height   5' 3  Weight  137 lbs 10 oz 135 lbs 13 oz  BMI   24.06  Systolic 134 138 875  Diastolic 85 85 80  Pulse  84 85     Physical Exam  VS: noted--normal. Gen: alert, NAD, NONTOXIC APPEARING. HEENT: eyes without injection, drainage, or swelling.  Ears: EACs clear, TMs with normal light reflex and landmarks.  Nose: Clear rhinorrhea, with some dried, crusty exudate adherent to mildly injected mucosa.  No purulent d/c.  No  paranasal sinus TTP.  No facial swelling.  Throat and mouth without focal lesion.  No pharyngial swelling, erythema, or exudate.   Neck: supple, no LAD.   LUNGS: CTA bilat, nonlabored resps.   CV: RRR, no m/r/g. EXT: no c/c/e SKIN: no rash   LABS:  Last CBC Lab Results  Component Value Date   WBC 5.8 01/18/2024   HGB 12.4 01/18/2024   HCT 39.3 01/18/2024   MCV 77.3 (L) 01/18/2024   MCH 22.2 (L) 09/14/2019   RDW 15.9 (H) 01/18/2024   PLT 328.0 01/18/2024   Lab Results  Component Value Date   IRON 86 03/07/2023   TIBC 482 (H) 03/07/2023   FERRITIN 3 (L) 03/07/2023   Last metabolic panel Lab Results  Component Value Date   GLUCOSE 93 01/18/2024   NA 138 01/18/2024   K 4.2 01/18/2024   CL 102 01/18/2024   CO2 27 01/18/2024   BUN 12 01/18/2024   CREATININE 0.85 01/18/2024   GFR 83.35 01/18/2024   CALCIUM 9.5 01/18/2024   PHOS 3.4 12/11/2015   PROT 7.8 01/18/2024   ALBUMIN 4.4 01/18/2024   BILITOT 0.4 01/18/2024   ALKPHOS 96 01/18/2024   AST 23 01/18/2024   ALT  27 01/18/2024   ANIONGAP 9 04/18/2019   Lab Results  Component Value Date   HGBA1C 6.0 01/18/2024   IMPRESSION AND PLAN:  URI with cough and congestion. Symptoms not improving at day 6-7--> doxycycline 100 twice daily x 7 days. Mucinex DM every 12 hours as needed and Tylenol 500 to 1000 mg every 6 hours as needed. Rapid flu today-->neg. Rapid strep today-->neg.  An After Visit Summary was printed and given to the patient.  FOLLOW UP: Return if symptoms worsen or fail to improve.  Signed:  Gerlene Hockey, MD           06/01/2024

## 2024-06-01 NOTE — Patient Instructions (Signed)
 Try over the counter mucinex DM for cough and congestion.

## 2024-06-01 NOTE — Addendum Note (Signed)
 Addended by: FLETA CARE D on: 06/01/2024 04:40 PM   Modules accepted: Orders

## 2024-06-01 NOTE — Telephone Encounter (Signed)
 FYI Only or Action Required?: FYI only for provider: appointment scheduled on 06/01/24.  Patient was last seen in primary care on 01/16/2024 by Domenica Harlene LABOR, MD.  Called Nurse Triage reporting Sore Throat, Headache, and Generalized Body Aches.  Symptoms began several days ago.  Interventions attempted: Rest, hydration, or home remedies.  Symptoms are: gradually worsening.  Triage Disposition: See Physician Within 24 Hours  Patient/caregiver understands and will follow disposition?: Yes           Copied from CRM #[id number]. Topic: Clinical - Red Word Triage >> Jun 01, 2024  1:22 PM Rea ORN wrote: Red Word that prompted transfer to Nurse Triage: sore throat since Saturday, body ache, headache, hoarse. Reason for Disposition  SEVERE throat pain (e.g., excruciating)    Scheduled at alternate [facility]  Answer Assessment - Initial Assessment Questions 1. ONSET: When did the throat start hurting? (Hours or days ago)      Saturday 2. SEVERITY: How bad is the sore throat? (Scale 1-10; mild, moderate or severe)     Still able to maintain hydration 3. STREP EXPOSURE: Has there been any exposure to strep within the past week? If Yes, ask: What type of contact occurred?      N/a 4.  VIRAL SYMPTOMS: Are there any symptoms of a cold, such as a runny nose, cough, hoarse voice or red eyes?      Hoarse voice, headache, body aches 5. FEVER: Do you have a fever? If Yes, ask: What is your temperature, how was it measured, and when did it start?     denies 6. PUS ON THE TONSILS: Is there pus on the tonsils in the back of your throat?     denies 7. OTHER SYMPTOMS: Do you have any other symptoms? (e.g., difficulty breathing, headache, rash)     Green mucus 8. PREGNANCY: Is there any chance you are pregnant? When was your last menstrual period?     N/a  Protocols used: Sore Throat-A-AH

## 2024-07-13 ENCOUNTER — Telehealth: Payer: Self-pay | Admitting: Family Medicine

## 2024-07-13 DIAGNOSIS — E785 Hyperlipidemia, unspecified: Secondary | ICD-10-CM

## 2024-07-13 NOTE — Telephone Encounter (Signed)
 PT needs labs if needed

## 2024-07-13 NOTE — Telephone Encounter (Signed)
 Called patient and no answer left detailed message.

## 2024-07-22 NOTE — Assessment & Plan Note (Signed)
 Encourage heart healthy diet such as MIND or DASH diet, increase exercise, avoid trans fats, simple carbohydrates and processed foods, consider a krill or fish or flaxseed oil cap daily.

## 2024-07-22 NOTE — Assessment & Plan Note (Signed)
 hgba1c acceptable, minimize simple carbs. Increase exercise as tolerated.

## 2024-07-22 NOTE — Assessment & Plan Note (Signed)
 Supplement and monitor

## 2024-07-22 NOTE — Progress Notes (Signed)
 "  Subjective:    Patient ID: Tara Stevenson, female    DOB: 02-05-1981, 44 y.o.   MRN: [id number]  No chief complaint on file.   HPI Discussed the use of AI scribe software for clinical note transcription with the patient, who gave verbal consent to proceed.  History of Present Illness Tara Stevenson is a 44 year old female who presents with symptoms of perimenopause and anxiety.  She reports hot flashes around 2 AM, waking up sweaty, and trouble sleeping. She also describes episodes of feeling faint, blurry vision, and a 'funny' feeling in her head, which can occur at any time, including during social events.  She experiences anxiety-related symptoms, such as feeling 'weird' or 'funny' in her head, especially in stressful situations. These episodes occur three to four times a week, typically in the morning around 11 or 12 o'clock, and last a few minutes. She feels anxious about going to stores alone due to fear of these episodes occurring. She monitors her blood pressure and blood sugar levels, which have been normal. She has a history of anxiety, particularly in stressful situations, and has previously been prescribed medication for anxiety during flights, which she did not take but found reassuring to have.  She reports gastrointestinal symptoms, including feeling bloated and passing gas frequently. No blood in her stool or major changes in bowel habits.  She has a persistent 'pimple' on her stomach for over three months, which sometimes gets smaller but not larger than its current size. It is painful to touch and has some scar tissue.  She experiences a runny nose, particularly when eating, and has tried nasal saline without relief.  Her social history includes being self-employed and managing a busy household with children involved in multiple activities, which contributes to her stress levels.    Past Medical History:  Diagnosis Date   Allergy    Anemia    Cervical radiculitis     Heart murmur    Hx of typhoid fever    Hyperglycemia 04/15/2016   Hyperlipidemia, mild 04/15/2016   Left shoulder pain 04/15/2016   Lightheadedness 11/04/2016   Lumbar spine pain    Tachycardia 11/04/2016   Thoracic spine pain     Past Surgical History:  Procedure Laterality Date   CESAREAN SECTION     2005   HERNIA REPAIR     2007    Family History  Problem Relation Age of Onset   Cancer Mother 27       uterine cancer with ovarian cyst   Hypertension Mother    Osteoarthritis Mother    Arthritis Father    Hypertension Father    Hyperlipidemia Father    Diabetes Father    Other Father        post herpetic neuralgia   Hypertension Maternal Grandmother    Arthritis Maternal Grandmother    Diabetes Maternal Grandfather    Stroke Maternal Grandfather    Hypertension Maternal Grandfather    Arthritis Paternal Grandmother    Hypertension Paternal Grandmother    Diabetes Paternal Grandmother    Cerebrovascular Accident Paternal Grandmother    Hypertension Paternal Grandfather    Cerebrovascular Accident Paternal Grandfather    Breast cancer Neg Hx     Social History   Socioeconomic History   Marital status: Married    Spouse name: Not on file   Number of children: 2   Years of education: BS degree   Highest education level: Bachelor's degree (e.g., BA, AB, BS)  Occupational History   Occupation: Self employeed  Tobacco Use   Smoking status: Never   Smokeless tobacco: Never  Substance and Sexual Activity   Alcohol use: No    Alcohol/week: 0.0 standard drinks of alcohol   Drug use: No   Sexual activity: Yes    Birth control/protection: None  Other Topics Concern   Not on file  Social History Narrative   Pt married / 2 children (sons 2005 and 2007)   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    Pt has bachelors in lobbyist   No smoking    No alcohol   No drug use   Cup of tea a day    [place]   Enjoys television, drawing,  Shopping   Grew up in [place]       Social Drivers of Health   Tobacco Use: Low Risk (06/01/2024)   Patient History    Smoking Tobacco Use: Never    Smokeless Tobacco Use: Never    Passive Exposure: Not on file  Financial Resource Strain: Low Risk (10/13/2023)   Overall Financial Resource Strain (CARDIA)    Difficulty of Paying Living Expenses: Not hard at all  Food Insecurity: No Food Insecurity (10/13/2023)   Hunger Vital Sign    Worried About Running Out of Food in the Last Year: Never true    Ran Out of Food in the Last Year: Never true  Transportation Needs: No Transportation Needs (10/13/2023)   PRAPARE - Administrator, Civil Service (Medical): No    Lack of Transportation (Non-Medical): No  Physical Activity: Unknown (10/13/2023)   Exercise Vital Sign    Days of Exercise per Week: Patient declined    Minutes of Exercise per Session: Not on file  Stress: No Stress Concern Present (10/13/2023)   [employer] of Occupational Health - Occupational Stress Questionnaire    Feeling of Stress : Not at all  Social Connections: Moderately Integrated (10/13/2023)   Social Connection and Isolation Panel    Frequency of Communication with Friends and Family: More than three times a week    Frequency of Social Gatherings with Friends and Family: More than three times a week    Attends Religious Services: More than 4 times per year    Active Member of [employer] or Organizations: No    Attends Engineer, Structural: Not on file    Marital Status: Married  Catering Manager Violence: Not on file  Depression (PHQ2-9): Low Risk (03/14/2023)   Depression (PHQ2-9)    PHQ-2 Score: 0  Alcohol Screen: Not on file  Housing: Unknown (10/13/2023)   Housing Stability Vital Sign    Unable to Pay for Housing in the Last Year: No    Number of Times Moved in the Last Year: Not on file    Homeless in the Last Year: No  Utilities: Not on file  Health Literacy: Not on file    Outpatient Medications Prior to Visit   Medication Sig Dispense Refill   KURVELO 0.15-30 MG-MCG tablet Take 1 tablet by mouth daily.     No facility-administered medications prior to visit.    Allergies[1]  Review of Systems  Constitutional:  Positive for malaise/fatigue. Negative for fever.  HENT:  Positive for congestion.   Eyes:  Negative for blurred vision.  Respiratory:  Negative for shortness of breath.   Cardiovascular:  Positive for palpitations. Negative for chest pain and leg swelling.  Gastrointestinal:  Positive for abdominal pain. Negative for blood in stool  and nausea.  Genitourinary:  Negative for dysuria and frequency.  Musculoskeletal:  Negative for falls.  Skin:  Negative for rash.  Neurological:  Positive for dizziness. Negative for loss of consciousness and headaches.  Endo/Heme/Allergies:  Negative for environmental allergies.  Psychiatric/Behavioral:  Negative for depression. The patient is nervous/anxious.        Objective:    Physical Exam Constitutional:      General: She is not in acute distress.    Appearance: Normal appearance. She is well-developed. She is not toxic-appearing.  HENT:     Head: Normocephalic and atraumatic.     Right Ear: External ear normal.     Left Ear: External ear normal.     Nose: Nose normal.  Eyes:     General:        Right eye: No discharge.        Left eye: No discharge.     Conjunctiva/sclera: Conjunctivae normal.  Neck:     Thyroid : No thyromegaly.  Cardiovascular:     Rate and Rhythm: Normal rate and regular rhythm.     Heart sounds: Normal heart sounds. No murmur heard. Pulmonary:     Effort: Pulmonary effort is normal. No respiratory distress.     Breath sounds: Normal breath sounds.  Abdominal:     General: Bowel sounds are normal.     Palpations: Abdomen is soft.     Tenderness: There is no abdominal tenderness. There is no guarding.  Musculoskeletal:        General: Normal range of motion.     Cervical back: Neck supple.   Lymphadenopathy:     Cervical: No cervical adenopathy.  Skin:    General: Skin is warm and dry.  Neurological:     Mental Status: She is alert and oriented to person, place, and time.  Psychiatric:        Mood and Affect: Mood normal.        Behavior: Behavior normal.        Thought Content: Thought content normal.        Judgment: Judgment normal.    There were no vitals taken for this visit. Wt Readings from Last 3 Encounters:  06/01/24 137 lb 9.6 oz (62.4 kg)  01/16/24 135 lb 12.8 oz (61.6 kg)  10/20/23 132 lb 9.6 oz (60.1 kg)    Diabetic Foot Exam - Simple   No data filed    Lab Results  Component Value Date   WBC 5.8 01/18/2024   HGB 12.4 01/18/2024   HCT 39.3 01/18/2024   PLT 328.0 01/18/2024   GLUCOSE 93 01/18/2024   CHOL 264 (H) 01/18/2024   TRIG 116.0 01/18/2024   HDL 48.40 01/18/2024   LDLCALC 192 (H) 01/18/2024   ALT 27 01/18/2024   AST 23 01/18/2024   NA 138 01/18/2024   K 4.2 01/18/2024   CL 102 01/18/2024   CREATININE 0.85 01/18/2024   BUN 12 01/18/2024   CO2 27 01/18/2024   TSH 1.70 08/24/2023   HGBA1C 6.0 01/18/2024    Lab Results  Component Value Date   TSH 1.70 08/24/2023   Lab Results  Component Value Date   WBC 5.8 01/18/2024   HGB 12.4 01/18/2024   HCT 39.3 01/18/2024   MCV 77.3 (L) 01/18/2024   PLT 328.0 01/18/2024   Lab Results  Component Value Date   NA 138 01/18/2024   K 4.2 01/18/2024   CO2 27 01/18/2024   GLUCOSE 93 01/18/2024   BUN 12  01/18/2024   CREATININE 0.85 01/18/2024   BILITOT 0.4 01/18/2024   ALKPHOS 96 01/18/2024   AST 23 01/18/2024   ALT 27 01/18/2024   PROT 7.8 01/18/2024   ALBUMIN 4.4 01/18/2024   CALCIUM 9.5 01/18/2024   ANIONGAP 9 04/18/2019   GFR 83.35 01/18/2024   Lab Results  Component Value Date   CHOL 264 (H) 01/18/2024   Lab Results  Component Value Date   HDL 48.40 01/18/2024   Lab Results  Component Value Date   LDLCALC 192 (H) 01/18/2024   Lab Results  Component Value Date    TRIG 116.0 01/18/2024   Lab Results  Component Value Date   CHOLHDL 5 01/18/2024   Lab Results  Component Value Date   HGBA1C 6.0 01/18/2024       Assessment & Plan:  Vitamin D  deficiency Assessment & Plan: Supplement and monitor    Hyperlipidemia, mild Assessment & Plan: Encourage heart healthy diet such as MIND or DASH diet, increase exercise, avoid trans fats, simple carbohydrates and processed foods, consider a krill or fish or flaxseed oil cap daily.    Hyperglycemia Assessment & Plan: hgba1c acceptable, minimize simple carbs. Increase exercise as tolerated.     Assessment and Plan Assessment & Plan Perimenopausal symptoms with anxiety and palpitations Experiencing perimenopausal symptoms including hot flashes, lightheadedness, and palpitations, occurring 3-4 times a week, often in the morning. Normal EKG, blood pressure, and blood sugar levels. Symptoms likely related to hormonal changes and elevated cortisol levels during perimenopause. Anxiety may be exacerbated by stress and lack of exercise. - Prescribed alprazolam  0.25 mg, 5 tablets, to be taken as needed for symptoms lasting more than a minute. - Encouraged regular exercise to help manage stress and improve energy levels. - Discussed potential use of ashwagandha with caution, ensuring low dose and reliable product source. - Ordered basic lab panel to check for any underlying issues.  Abdominal bloating and gas Reports abdominal bloating and increased gas, with no changes in bowel habits or blood in stool. - Referred to gastroenterologist Dr. Ivana for further evaluation and possible endoscopy.  Hyperlipidemia Cholesterol levels are elevated but not currently a major concern due to lack of other symptoms. No current exercise routine. - Encouraged regular exercise to help manage cholesterol levels.  Chronic vasomotor rhinitis Experiences chronic runny nose, especially when eating. Previous use of Flonase  caused restlessness. Likely vasomotor rhinitis. - Recommended nasal saline spray twice daily and as needed for dryness or exposure to dust. - Prescribed Astelin  nasal spray for runny nose.  Abdominal skin cyst with scarring Persistent abdominal cyst with scarring, present for over three months. No signs of malignancy. Likely cystic acne with scar tissue formation. - Advised use of hydrogen peroxide and gauze for debridement of the cyst. - Apply Neosporin after debridement. - Monitor for changes or drainage; will consider surgical consultation if symptoms persist or worsen.  General Health Maintenance Routine health maintenance discussed, including the importance of regular screenings and lifestyle modifications. - Ordered basic lab panel to check overall health status. - Discussed the importance of regular exercise for overall health and stress management.  Recording duration: 25 minutes     Harlene Horton, MD    [1]  Allergies Allergen Reactions   Flonase [Fluticasone Propionate]     Anxious, restless   "

## 2024-07-23 ENCOUNTER — Other Ambulatory Visit: Payer: Self-pay

## 2024-07-23 ENCOUNTER — Ambulatory Visit: Payer: Self-pay | Admitting: Family Medicine

## 2024-07-23 DIAGNOSIS — E785 Hyperlipidemia, unspecified: Secondary | ICD-10-CM

## 2024-07-23 LAB — LIPID PANEL
Cholesterol: 261 mg/dL — ABNORMAL HIGH (ref 28–200)
HDL: 44.4 mg/dL
LDL Cholesterol: 193 mg/dL — ABNORMAL HIGH (ref 10–99)
NonHDL: 216.51
Total CHOL/HDL Ratio: 6
Triglycerides: 116 mg/dL (ref 10.0–149.0)
VLDL: 23.2 mg/dL (ref 0.0–40.0)

## 2024-07-23 NOTE — Progress Notes (Signed)
 Pt has upcoming appointment on 07/27/23 and would discuss the cholesterol medication at the appointment.

## 2024-07-26 ENCOUNTER — Ambulatory Visit: Payer: Self-pay | Admitting: Family Medicine

## 2024-07-26 ENCOUNTER — Encounter: Payer: Self-pay | Admitting: Family Medicine

## 2024-07-26 ENCOUNTER — Ambulatory Visit: Admitting: Family Medicine

## 2024-07-26 VITALS — BP 118/78 | HR 88 | Resp 16 | Ht 63.0 in | Wt 136.8 lb

## 2024-07-26 DIAGNOSIS — E785 Hyperlipidemia, unspecified: Secondary | ICD-10-CM

## 2024-07-26 DIAGNOSIS — R140 Abdominal distension (gaseous): Secondary | ICD-10-CM

## 2024-07-26 DIAGNOSIS — R002 Palpitations: Secondary | ICD-10-CM | POA: Insufficient documentation

## 2024-07-26 DIAGNOSIS — E559 Vitamin D deficiency, unspecified: Secondary | ICD-10-CM

## 2024-07-26 DIAGNOSIS — R739 Hyperglycemia, unspecified: Secondary | ICD-10-CM | POA: Diagnosis not present

## 2024-07-26 DIAGNOSIS — Z1211 Encounter for screening for malignant neoplasm of colon: Secondary | ICD-10-CM

## 2024-07-26 LAB — COMPREHENSIVE METABOLIC PANEL WITH GFR
ALT: 14 U/L (ref 3–35)
AST: 17 U/L (ref 5–37)
Albumin: 4.3 g/dL (ref 3.5–5.2)
Alkaline Phosphatase: 78 U/L (ref 39–117)
BUN: 14 mg/dL (ref 6–23)
CO2: 28 meq/L (ref 19–32)
Calcium: 9.3 mg/dL (ref 8.4–10.5)
Chloride: 104 meq/L (ref 96–112)
Creatinine, Ser: 0.77 mg/dL (ref 0.40–1.20)
GFR: 93.51 mL/min
Glucose, Bld: 94 mg/dL (ref 70–99)
Potassium: 3.9 meq/L (ref 3.5–5.1)
Sodium: 139 meq/L (ref 135–145)
Total Bilirubin: 0.3 mg/dL (ref 0.2–1.2)
Total Protein: 7.7 g/dL (ref 6.0–8.3)

## 2024-07-26 LAB — LIPID PANEL
Cholesterol: 255 mg/dL — ABNORMAL HIGH (ref 28–200)
HDL: 43.9 mg/dL
LDL Cholesterol: 189 mg/dL — ABNORMAL HIGH (ref 10–99)
NonHDL: 211.29
Total CHOL/HDL Ratio: 6
Triglycerides: 110 mg/dL (ref 10.0–149.0)
VLDL: 22 mg/dL (ref 0.0–40.0)

## 2024-07-26 LAB — CBC WITH DIFFERENTIAL/PLATELET
Basophils Absolute: 0 K/uL (ref 0.0–0.1)
Basophils Relative: 0.4 % (ref 0.0–3.0)
Eosinophils Absolute: 0.1 K/uL (ref 0.0–0.7)
Eosinophils Relative: 1.5 % (ref 0.0–5.0)
HCT: 39.8 % (ref 36.0–46.0)
Hemoglobin: 12.9 g/dL (ref 12.0–15.0)
Lymphocytes Relative: 30.1 % (ref 12.0–46.0)
Lymphs Abs: 1.5 K/uL (ref 0.7–4.0)
MCHC: 32.3 g/dL (ref 30.0–36.0)
MCV: 81 fl (ref 78.0–100.0)
Monocytes Absolute: 0.4 K/uL (ref 0.1–1.0)
Monocytes Relative: 8.7 % (ref 3.0–12.0)
Neutro Abs: 2.9 K/uL (ref 1.4–7.7)
Neutrophils Relative %: 59.3 % (ref 43.0–77.0)
Platelets: 274 K/uL (ref 150.0–400.0)
RBC: 4.92 Mil/uL (ref 3.87–5.11)
RDW: 14.5 % (ref 11.5–15.5)
WBC: 4.9 K/uL (ref 4.0–10.5)

## 2024-07-26 LAB — HEMOGLOBIN A1C: Hgb A1c MFr Bld: 5.8 % (ref 4.6–6.5)

## 2024-07-26 LAB — TSH: TSH: 1.03 u[IU]/mL (ref 0.35–5.50)

## 2024-07-26 LAB — VITAMIN D 25 HYDROXY (VIT D DEFICIENCY, FRACTURES): VITD: 30.82 ng/mL (ref 30.00–100.00)

## 2024-07-26 NOTE — Patient Instructions (Signed)
 Start with nasal saline twice daily and as needed Astelin  nasal spray helps with runny noses  Nonallergic Rhinitis Nonallergic rhinitis is inflammation of the mucous membrane inside the nose. The mucous membrane is the tissue that produces mucus. This condition is different from having allergic rhinitis, which is an allergy that affects the nose. Allergic rhinitis occurs when the body's defense system, or immune system, reacts to a substance that a person is allergic to (allergen), such as pollen, pet dander, mold, or dust. Nonallergic rhinitis has many similar symptoms, but it is not caused by allergens. Nonallergic rhinitis can be an acute or chronic problem. This means it can be short-term or long-term. What are the causes? This condition may be caused by many different things. Some common types of nonallergic rhinitis include: Infectious rhinitis. This is usually caused by an infection in the nose, throat, or upper airways (upper respiratory system). Vasomotor rhinitis. This is the most common type. It is caused by too much blood flow through your nose, and makes your nose swell. It is triggered by strong odors, cold air, stress, drinking alcohol, cigarette smoke, or changes in the weather. Occupational rhinitis. This type is caused by triggers in the workplace, such as chemicals, dust, animal dander, or air pollution. Hormonal rhinitis, in female teens and adults. This type is caused by an increase in the hormone estrogen and may happen during pregnancy, puberty, or monthly menstrual periods. Hormonal rhinitis gives you fewer symptoms when estrogen levels drop. Drug-induced rhinitis. Several types of medicines can cause this, such as medicines for high blood pressure or heart disease, aspirin, or NSAIDs. Nonallergic rhinitis with eosinophilia syndrome (NARES). This type is caused by having too much eosinophil, a type of white blood cell. Other causes include a reaction to eating hot or spicy  foods. This does not usually cause long-term symptoms. In some cases, the cause of nonallergic rhinitis is not known. What increases the risk? You are more likely to develop this condition if: You are 45-45 years of age. You are female. People who are female are twice as likely to have this condition. What are the signs or symptoms? Common symptoms of this condition include: Stuffy nose (nasal congestion). Runny nose. A feeling of mucus dripping down the back of your throat (postnasal drip). Trouble sleeping. Tiredness, or fatigue. Other symptoms include: Sneezing. Coughing. Itchy nose. Bloodshot eyes. How is this diagnosed? This type may be diagnosed based on: Your symptoms and medical history. A physical exam. Allergy testing to rule out allergic rhinitis. You may have skin tests or blood tests. Your health care provider may also take a swab of nasal discharge to look for an increased number of eosinophils. This is done to confirm a diagnosis of NARES. How is this treated? Treatment for this condition depends on the cause. No single treatment works for everyone. Work with your provider to find the best treatment for you. Treatment may include: Avoiding the things that trigger your symptoms. Medicines to relieve congestion, such as: Steroid nasal spray. There are many types. You may need to try a few to find out which one works best. Decongestant medicine. This treats nasal congestion and may be given by mouth or as a nasal spray. These medicines are used only for a short time. Medicines to relieve a runny nose. These may include antihistamine medicines or decongestant nasal sprays. Nasal irrigation. This involves using a salt-water (saline) spray or saline container called a neti pot. Nasal irrigation helps to clear away mucus and  keep your nasal passages moist. Surgery to remove part of your mucous membrane. This is done in severe cases if the condition has not improved after 6-12  months of treatment. Follow these instructions at home: Medicines Take or use over-the-counter and prescription medicines only as told by your provider. Do not stop using your medicine even if you start to feel better. Do not take NSAIDs, such as ibuprofen, or medicines that contain aspirin if they make your symptoms worse. Lifestyle Do not drink alcohol if it makes your symptoms worse. Do not use any products that contain nicotine or tobacco. These products include cigarettes, chewing tobacco, and vaping devices, such as e-cigarettes. If you need help quitting, ask your provider. Avoid secondhand smoke. General instructions Avoid triggers that make your symptoms worse. Use nasal irrigation as told by your provider. Sleep with the head of your bed raised. This may reduce nasal congestion when you sleep. Drink enough fluid to keep your pee (urine) pale yellow. Contact a health care provider if: You have a fever. Your symptoms are getting worse at home. Your symptoms do not lessen with medicine. You develop new symptoms, especially a headache or nosebleed. Get help right away if: You have difficulty breathing. This symptom may be an emergency. Get help right away. Call 911. Do not wait to see if the symptoms will go away. Do not drive yourself to the hospital. This information is not intended to replace advice given to you by your health care provider. Make sure you discuss any questions you have with your health care provider. Document Revised: 03/09/2022 Document Reviewed: 03/09/2022 Elsevier Patient Education  2024 [employer].

## 2024-07-26 NOTE — Assessment & Plan Note (Signed)
 Has episodes a couple times of week. No associated symptoms and she has used Cardia mobile and bp cuff and Pox and everything is normal. Will try Alprazolam  prn and see if it helps for anxiety and she will see if that helps

## 2024-07-27 NOTE — Progress Notes (Signed)
 Patient reviewed via MyChart.

## 2024-07-29 ENCOUNTER — Encounter: Payer: Self-pay | Admitting: Family Medicine

## 2024-07-31 ENCOUNTER — Encounter: Payer: Self-pay | Admitting: Family Medicine

## 2024-08-01 ENCOUNTER — Other Ambulatory Visit: Payer: Self-pay | Admitting: Family Medicine

## 2024-08-01 MED ORDER — ALPRAZOLAM 0.25 MG PO TABS: 0.2500 mg | ORAL_TABLET | Freq: Two times a day (BID) | ORAL | 1 refills | Status: AC | PRN
